# Patient Record
Sex: Female | Born: 1970 | Race: Black or African American | Hispanic: No | Marital: Single | Smoking: Current some day smoker
Health system: Southern US, Community
[De-identification: ages and names within clinical notes are randomized; demographics above are authoritative.]

## PROBLEM LIST (undated history)

## (undated) DIAGNOSIS — I10 Essential (primary) hypertension: Secondary | ICD-10-CM

## (undated) HISTORY — PX: TUBAL LIGATION: SHX77

---

## 2006-08-03 DIAGNOSIS — I10 Essential (primary) hypertension: Secondary | ICD-10-CM

## 2006-08-03 HISTORY — DX: Essential (primary) hypertension: I10

## 2008-09-09 ENCOUNTER — Emergency Department (HOSPITAL_COMMUNITY): Admission: EM | Admit: 2008-09-09 | Discharge: 2008-09-09 | Payer: Self-pay | Admitting: Emergency Medicine

## 2009-06-30 ENCOUNTER — Emergency Department (HOSPITAL_COMMUNITY): Admission: EM | Admit: 2009-06-30 | Discharge: 2009-06-30 | Payer: Self-pay | Admitting: Emergency Medicine

## 2009-09-25 ENCOUNTER — Encounter: Admission: RE | Admit: 2009-09-25 | Discharge: 2009-09-25 | Payer: Self-pay | Admitting: Infectious Diseases

## 2010-02-15 ENCOUNTER — Emergency Department (HOSPITAL_COMMUNITY): Admission: EM | Admit: 2010-02-15 | Discharge: 2010-02-15 | Payer: Self-pay | Admitting: Emergency Medicine

## 2010-03-02 ENCOUNTER — Emergency Department (HOSPITAL_COMMUNITY): Admission: EM | Admit: 2010-03-02 | Discharge: 2010-03-02 | Payer: Self-pay | Admitting: Emergency Medicine

## 2010-08-31 ENCOUNTER — Emergency Department (HOSPITAL_COMMUNITY)
Admission: EM | Admit: 2010-08-31 | Discharge: 2010-08-31 | Payer: Self-pay | Source: Home / Self Care | Admitting: Emergency Medicine

## 2010-11-05 LAB — POCT I-STAT, CHEM 8
Calcium, Ion: 1.17 mmol/L (ref 1.12–1.32)
Hemoglobin: 12.6 g/dL (ref 12.0–15.0)
Potassium: 4.5 mEq/L (ref 3.5–5.1)
TCO2: 25 mmol/L (ref 0–100)

## 2010-11-05 LAB — URINALYSIS, ROUTINE W REFLEX MICROSCOPIC
Bilirubin Urine: NEGATIVE
Ketones, ur: NEGATIVE mg/dL
Leukocytes, UA: NEGATIVE
Nitrite: NEGATIVE

## 2010-11-05 LAB — URINE MICROSCOPIC-ADD ON

## 2010-11-05 LAB — GC/CHLAMYDIA PROBE AMP, GENITAL: GC Probe Amp, Genital: NEGATIVE

## 2010-11-05 LAB — WET PREP, GENITAL: Trich, Wet Prep: NONE SEEN

## 2011-04-02 ENCOUNTER — Emergency Department (HOSPITAL_COMMUNITY)
Admission: EM | Admit: 2011-04-02 | Discharge: 2011-04-02 | Disposition: A | Discharge status: Home / Self Care | Payer: Self-pay | Attending: Emergency Medicine | Admitting: Emergency Medicine

## 2011-04-02 DIAGNOSIS — L298 Other pruritus: Secondary | ICD-10-CM | POA: Insufficient documentation

## 2011-04-02 DIAGNOSIS — F329 Major depressive disorder, single episode, unspecified: Secondary | ICD-10-CM | POA: Insufficient documentation

## 2011-04-02 DIAGNOSIS — I10 Essential (primary) hypertension: Secondary | ICD-10-CM | POA: Insufficient documentation

## 2011-04-02 DIAGNOSIS — F3289 Other specified depressive episodes: Secondary | ICD-10-CM | POA: Insufficient documentation

## 2011-04-02 DIAGNOSIS — K089 Disorder of teeth and supporting structures, unspecified: Secondary | ICD-10-CM | POA: Insufficient documentation

## 2011-04-02 DIAGNOSIS — R21 Rash and other nonspecific skin eruption: Secondary | ICD-10-CM | POA: Insufficient documentation

## 2011-04-02 DIAGNOSIS — K029 Dental caries, unspecified: Secondary | ICD-10-CM | POA: Insufficient documentation

## 2011-04-02 DIAGNOSIS — K0381 Cracked tooth: Secondary | ICD-10-CM | POA: Insufficient documentation

## 2011-04-02 DIAGNOSIS — L2989 Other pruritus: Secondary | ICD-10-CM | POA: Insufficient documentation

## 2011-04-02 DIAGNOSIS — R1011 Right upper quadrant pain: Secondary | ICD-10-CM | POA: Insufficient documentation

## 2011-04-02 LAB — URINALYSIS, ROUTINE W REFLEX MICROSCOPIC
Glucose, UA: NEGATIVE mg/dL
Ketones, ur: 15 mg/dL — AB
Nitrite: NEGATIVE
pH: 5.5 (ref 5.0–8.0)

## 2011-04-09 ENCOUNTER — Emergency Department (HOSPITAL_COMMUNITY)
Admission: EM | Admit: 2011-04-09 | Discharge: 2011-04-10 | Disposition: A | Discharge status: Home / Self Care | Payer: Self-pay | Attending: Emergency Medicine | Admitting: Emergency Medicine

## 2011-04-09 DIAGNOSIS — R21 Rash and other nonspecific skin eruption: Secondary | ICD-10-CM | POA: Insufficient documentation

## 2011-04-09 DIAGNOSIS — F329 Major depressive disorder, single episode, unspecified: Secondary | ICD-10-CM | POA: Insufficient documentation

## 2011-04-09 DIAGNOSIS — F17200 Nicotine dependence, unspecified, uncomplicated: Secondary | ICD-10-CM | POA: Insufficient documentation

## 2011-04-09 DIAGNOSIS — I10 Essential (primary) hypertension: Secondary | ICD-10-CM | POA: Insufficient documentation

## 2011-04-09 DIAGNOSIS — F3289 Other specified depressive episodes: Secondary | ICD-10-CM | POA: Insufficient documentation

## 2012-06-30 ENCOUNTER — Emergency Department (HOSPITAL_COMMUNITY)
Admission: EM | Admit: 2012-06-30 | Discharge: 2012-06-30 | Disposition: A | Discharge status: Home / Self Care | Payer: Self-pay | Attending: Emergency Medicine | Admitting: Emergency Medicine

## 2012-06-30 ENCOUNTER — Encounter (HOSPITAL_COMMUNITY): Payer: Self-pay | Admitting: *Deleted

## 2012-06-30 DIAGNOSIS — Z2089 Contact with and (suspected) exposure to other communicable diseases: Secondary | ICD-10-CM

## 2012-06-30 DIAGNOSIS — L299 Pruritus, unspecified: Secondary | ICD-10-CM | POA: Insufficient documentation

## 2012-06-30 DIAGNOSIS — F17200 Nicotine dependence, unspecified, uncomplicated: Secondary | ICD-10-CM | POA: Insufficient documentation

## 2012-06-30 DIAGNOSIS — B86 Scabies: Secondary | ICD-10-CM | POA: Insufficient documentation

## 2012-06-30 MED ORDER — HYDROXYZINE HCL 10 MG PO TABS: 10.0000 mg | ORAL_TABLET | Freq: Four times a day (QID) | ORAL | Status: DC | PRN

## 2012-06-30 MED ORDER — LORAZEPAM 0.5 MG PO TABS
0.5000 mg | ORAL_TABLET | Freq: Once | ORAL | Status: AC
  Administered 2012-06-30: 0.5 mg via ORAL
  Filled 2012-06-30: qty 1

## 2012-06-30 MED ORDER — PERMETHRIN 5 % EX CREA: TOPICAL_CREAM | CUTANEOUS | Status: DC

## 2012-06-30 MED ORDER — HYDROXYZINE HCL 25 MG PO TABS
25.0000 mg | ORAL_TABLET | Freq: Once | ORAL | Status: AC
  Administered 2012-06-30: 25 mg via ORAL
  Filled 2012-06-30: qty 1

## 2012-06-30 MED ORDER — LORAZEPAM 2 MG/ML IJ SOLN: 0.5000 mg | Freq: Once | INTRAMUSCULAR | Status: DC
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
## 2012-06-30 NOTE — ED Notes (Signed)
Pt states that she has had scabies before and the same sensation today. Pt states rash on her throat and under her arms. Pt states sensation of biting and itching. Pt got it from her friend.

## 2012-07-01 NOTE — ED Provider Notes (Signed)
Medical screening examination/treatment/procedure(s) were performed by non-physician practitioner and as supervising physician I was immediately available for consultation/collaboration.   Steen Bisig, MD 07/01/12 0027 

## 2012-07-17 ENCOUNTER — Encounter (HOSPITAL_COMMUNITY): Payer: Self-pay | Admitting: *Deleted

## 2012-07-17 ENCOUNTER — Emergency Department (HOSPITAL_COMMUNITY)
Admission: EM | Admit: 2012-07-17 | Discharge: 2012-07-17 | Disposition: A | Discharge status: Home / Self Care | Payer: Self-pay | Attending: Emergency Medicine | Admitting: Emergency Medicine

## 2012-07-17 DIAGNOSIS — L299 Pruritus, unspecified: Secondary | ICD-10-CM | POA: Insufficient documentation

## 2012-07-17 DIAGNOSIS — F17200 Nicotine dependence, unspecified, uncomplicated: Secondary | ICD-10-CM | POA: Insufficient documentation

## 2012-07-17 MED ORDER — DIPHENHYDRAMINE HCL 25 MG PO CAPS: 25.0000 mg | ORAL_CAPSULE | Freq: Once | ORAL | Status: DC

## 2012-07-17 NOTE — ED Notes (Signed)
Pt left before being given benadryl po.

## 2012-07-17 NOTE — ED Provider Notes (Signed)
History   This chart was scribed for Suzi Roots, MD by Melba Coon, ED Scribe. The patient was seen in room TR01C/TR01C and the patient's care was started at 5:45PM.    CSN: [id number]  Arrival date & time 07/17/12  1710   None     Chief Complaint  Patient presents with  . Rash    (Consider location/radiation/quality/duration/timing/severity/associated sxs/prior treatment) The history is provided by the patient. No language interpreter was used.   Diana Bean is a 41 y.o. female who presents to the Emergency Department complaining of persistent, moderate to severe rash to the neck with an onset 3 weeks ago. She has been seen in the ED in the past 3 weeks for similar rash symptoms. She was told it was scabies in the past but she can not afford medication. She describes the pain as pruritic and pinching. She denies using any new soaps, sprays, or body products. Denies HA, fever, sore throat. She denies rash to any other parts of her body. No known allergies. No other pertinent medical symptoms. No change in meds or new meds. No change in any home or personal products. No change in diet. No rash or skin lesions. Itching localized to area around neck/superior chest.   History reviewed. No pertinent past medical history.  History reviewed. No pertinent past surgical history.  History reviewed. No pertinent family history.  History  Substance Use Topics  . Smoking status: Current Every Day Smoker  . Smokeless tobacco: Not on file  . Alcohol Use: Yes    OB History    Grav Para Term Preterm Abortions TAB SAB Ect Mult Living                  Review of Systems 10 Systems reviewed and all are negative for acute change except as noted in the HPI.   Allergies  Review of patient's allergies indicates no known allergies.  Home Medications   Current Outpatient Rx  Name  Route  Sig  Dispense  Refill  . DIPHENHYDRAMINE HCL 25 MG PO TABS   Oral   Take 25 mg by mouth every 6  (six) hours as needed. For itching         . IBUPROFEN 200 MG PO TABS   Oral   Take 600 mg by mouth every 6 (six) hours as needed. For pain           BP 121/75  Pulse 94  Temp 97.7 F (36.5 C) (Oral)  Resp 18  SpO2 100%  Physical Exam  Nursing note and vitals reviewed. Constitutional: She is oriented to person, place, and time. She appears well-developed and well-nourished. No distress.       Awake, alert, nontoxic appearance.  HENT:  Head: Atraumatic.  Nose: Nose normal.  Mouth/Throat: Oropharynx is clear and moist.  Eyes: Conjunctivae normal are normal. Right eye exhibits no discharge. Left eye exhibits no discharge.  Neck: Normal range of motion. Neck supple.  Cardiovascular: Normal rate, regular rhythm and normal heart sounds.   No murmur heard. Pulmonary/Chest: Effort normal and breath sounds normal. No respiratory distress. She has no wheezes. She has no rales. She exhibits no tenderness.  Abdominal: Soft. There is no tenderness.  Musculoskeletal: She exhibits no tenderness.       Baseline ROM, no obvious new focal weakness.  Neurological: She is alert and oriented to person, place, and time.       Mental status and motor strength appears baseline for patient  and situation.  Skin: Skin is warm and dry. No rash noted. She is not diaphoretic.  Psychiatric: She has a normal mood and affect.    ED Course  Procedures (including critical care time)  COORDINATION OF CARE:  5:48PM - benadryl will be ordered for Diana Bean. She is advised that if she does have scabies, then the originally Rx medication from her last visit is the only option for treatment.     MDM  I personally performed the services described in this documentation, which was scribed in my presence. The recorded information has been reviewed and is accurate.  Pt requests med for itching. Benadryl po, pt states has ride, does not have to drive.  No rash or skin lesions noted.        Suzi Roots, MD 07/17/12 [phone]

## 2012-07-17 NOTE — ED Notes (Signed)
Reports being seen here recently for same, has itching rash to neck, was told it was scabies but unable to afford the cream prescribed.

## 2014-03-28 ENCOUNTER — Other Ambulatory Visit: Payer: Self-pay | Admitting: Infectious Disease

## 2014-03-28 ENCOUNTER — Ambulatory Visit
Admission: RE | Admit: 2014-03-28 | Discharge: 2014-03-28 | Disposition: A | Discharge status: Home / Self Care | Payer: No Typology Code available for payment source | Source: Ambulatory Visit | Attending: Infectious Disease | Admitting: Infectious Disease

## 2014-03-28 DIAGNOSIS — Z111 Encounter for screening for respiratory tuberculosis: Secondary | ICD-10-CM

## 2014-04-25 ENCOUNTER — Encounter (HOSPITAL_COMMUNITY): Payer: Self-pay | Admitting: Emergency Medicine

## 2014-04-25 ENCOUNTER — Emergency Department (HOSPITAL_COMMUNITY)
Admission: EM | Admit: 2014-04-25 | Discharge: 2014-04-25 | Disposition: A | Discharge status: Home / Self Care | Payer: Self-pay | Attending: Emergency Medicine | Admitting: Emergency Medicine

## 2014-04-25 DIAGNOSIS — M549 Dorsalgia, unspecified: Secondary | ICD-10-CM | POA: Insufficient documentation

## 2014-04-25 DIAGNOSIS — F17200 Nicotine dependence, unspecified, uncomplicated: Secondary | ICD-10-CM | POA: Insufficient documentation

## 2014-04-25 DIAGNOSIS — Z791 Long term (current) use of non-steroidal anti-inflammatories (NSAID): Secondary | ICD-10-CM | POA: Insufficient documentation

## 2014-04-25 MED ORDER — CYCLOBENZAPRINE HCL 10 MG PO TABS: 10.0000 mg | ORAL_TABLET | Freq: Two times a day (BID) | ORAL | Status: DC | PRN

## 2014-04-25 MED ORDER — MELOXICAM 7.5 MG PO TABS: 7.5000 mg | ORAL_TABLET | Freq: Every day | ORAL | Status: DC

## 2014-04-25 NOTE — ED Provider Notes (Signed)
CSN: [id number]     Arrival date & time 04/25/14  1753 History  This chart was scribed for non-physician practitioner, Noland Fordyce, PA-C,working with Wandra Arthurs, MD, by Marlowe Kays, ED Scribe. This patient was seen in room TR06C/TR06C and the patient's care was started at 6:37 PM.  Chief Complaint  Patient presents with  . Back Pain   Patient is a 43 y.o. female presenting with back pain. The history is provided by the patient. No language interpreter was used.  Back Pain Associated symptoms: no dysuria and no fever    HPI Comments:  Diana Bean is a 43 y.o. female who presents to the Emergency Department complaining of constant moderate sharp, aching mid, right-sided back pain onset last night. Pt states the pain begins in the back and radiates around her right side. She states she has taken aspirin with no relief of the pain. She reports taking a shower and letting the water run over the area with some relief. Movement makes the pain worse. Sitting still helps the pain subside slightly. She denies any trauma, injury or fall. She denies fever, chills, nausea, vomiting or dysuria. She denies any recent surgeries. No h/o nephrolithiasis, back surgery or abdominal surgery. She does not have a PCP.  History reviewed. No pertinent past medical history. History reviewed. No pertinent past surgical history. History reviewed. No pertinent family history. History  Substance Use Topics  . Smoking status: Current Every Day Smoker  . Smokeless tobacco: Not on file  . Alcohol Use: Yes   OB History   Grav Para Term Preterm Abortions TAB SAB Ect Mult Living                 Review of Systems  Constitutional: Negative for fever and chills.  Gastrointestinal: Negative for nausea and vomiting.  Genitourinary: Negative for dysuria.  Musculoskeletal: Positive for back pain.  All other systems reviewed and are negative.   Allergies  Review of patient's allergies indicates no known  allergies.  Home Medications   Prior to Admission medications   Medication Sig Start Date End Date Taking? Authorizing Provider  cyclobenzaprine (FLEXERIL) 10 MG tablet Take 1 tablet (10 mg total) by mouth 2 (two) times daily as needed for muscle spasms. 04/25/14   Noland Fordyce, PA-C  meloxicam (MOBIC) 7.5 MG tablet Take 1 tablet (7.5 mg total) by mouth daily. 04/25/14   Noland Fordyce, PA-C   Triage Vitals: BP 145/86  Pulse 82  Temp(Src) 98.2 F (36.8 C) (Oral)  Resp 15  Ht 5\' 6"  (1.676 m)  Wt 180 lb (81.647 kg)  BMI 29.07 kg/m2  SpO2 96% Physical Exam  Nursing note and vitals reviewed. Constitutional: She is oriented to person, place, and time. She appears well-developed and well-nourished.  HENT:  Head: Normocephalic and atraumatic.  Eyes: EOM are normal.  Neck: Normal range of motion.  Cardiovascular: Normal rate, regular rhythm and normal heart sounds.  Exam reveals no gallop and no friction rub.   No murmur heard. Pulmonary/Chest: Effort normal and breath sounds normal. No respiratory distress. She has no wheezes. She has no rales.  Abdominal: Soft. There is no tenderness.  Musculoskeletal: Normal range of motion. She exhibits tenderness.  Tender to right side of thoracic musculature. No midline spinal tenderness. Full ROM of all extremities.  Neurological: She is alert and oriented to person, place, and time.  Skin: Skin is warm and dry. No rash noted.  Psychiatric: She has a normal mood and affect. Her behavior is  normal.    ED Course  Procedures (including critical care time) DIAGNOSTIC STUDIES: Oxygen Saturation is 96% on RA, normal by my interpretation.   COORDINATION OF CARE: 6:43 PM- Will order pain medication prior to discharge. Will prescribe muscle relaxer and advised pt of return precautions. Pt verbalizes understanding and agrees to plan.  Medications - No data to display  Labs Review Labs Reviewed - No data to display  Imaging Review No results  found.   EKG Interpretation None      MDM   Final diagnoses:  Mid back pain on right side   Pt c/o mid-back right sided pain.  No known injuries. Denies fever, n/v/d. Pain does radiate into right flank. Denies urinary or vaginal symptoms. Pt is tender in right mid-back pain.   Denies hx of renal stones. Not concerned for emergent process taking place at this time. No red flag symptoms. Do not believe further workup needed at this time. Will tx as muscle strain. encouraged pt to f/u with PCP in 3-4 days if not improving. Return precautions provided. Pt verbalized understanding and agreement with tx plan.   I personally performed the services described in this documentation, which was scribed in my presence. The recorded information has been reviewed and is accurate.    Noland Fordyce, PA-C 04/25/14 2028

## 2014-04-25 NOTE — ED Provider Notes (Signed)
Medical screening examination/treatment/procedure(s) were performed by non-physician practitioner and as supervising physician I was immediately available for consultation/collaboration.   EKG Interpretation None        Wandra Arthurs, MD 04/25/14 2349

## 2014-04-25 NOTE — ED Notes (Signed)
PA at the bedside.

## 2014-04-25 NOTE — ED Notes (Signed)
Pt reports onset of mid right side back pain that started last night. Denies injury to back. Reports pain radiates around to right rib area. Pain increases wth movement, denies any urinary symptoms.

## 2014-07-17 ENCOUNTER — Encounter (HOSPITAL_COMMUNITY): Payer: Self-pay

## 2014-07-17 ENCOUNTER — Emergency Department (HOSPITAL_COMMUNITY)
Admission: EM | Admit: 2014-07-17 | Discharge: 2014-07-17 | Disposition: A | Discharge status: Home / Self Care | Payer: Self-pay | Attending: Emergency Medicine | Admitting: Emergency Medicine

## 2014-07-17 DIAGNOSIS — Y998 Other external cause status: Secondary | ICD-10-CM | POA: Insufficient documentation

## 2014-07-17 DIAGNOSIS — S0083XA Contusion of other part of head, initial encounter: Secondary | ICD-10-CM | POA: Insufficient documentation

## 2014-07-17 DIAGNOSIS — Y9289 Other specified places as the place of occurrence of the external cause: Secondary | ICD-10-CM | POA: Insufficient documentation

## 2014-07-17 DIAGNOSIS — Y9389 Activity, other specified: Secondary | ICD-10-CM | POA: Insufficient documentation

## 2014-07-17 DIAGNOSIS — S299XXA Unspecified injury of thorax, initial encounter: Secondary | ICD-10-CM | POA: Insufficient documentation

## 2014-07-17 DIAGNOSIS — Z720 Tobacco use: Secondary | ICD-10-CM | POA: Insufficient documentation

## 2014-07-17 DIAGNOSIS — Z791 Long term (current) use of non-steroidal anti-inflammatories (NSAID): Secondary | ICD-10-CM | POA: Insufficient documentation

## 2014-07-17 MED ORDER — HYDROCODONE-ACETAMINOPHEN 5-325 MG PO TABS
1.0000 | ORAL_TABLET | ORAL | Status: DC | PRN
Start: 2014-07-17 — End: 2015-04-19

## 2014-07-17 MED ORDER — HYDROCODONE-ACETAMINOPHEN 5-325 MG PO TABS
1.0000 | ORAL_TABLET | Freq: Once | ORAL | Status: AC
  Administered 2014-07-17: 1 via ORAL
  Filled 2014-07-17: qty 1

## 2014-07-17 MED ORDER — IBUPROFEN 800 MG PO TABS: 800.0000 mg | ORAL_TABLET | Freq: Three times a day (TID) | ORAL | Status: DC
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
## 2014-07-17 NOTE — Discharge Instructions (Signed)
Contusion A contusion is a deep bruise. Contusions are the result of an injury that caused bleeding under the skin. The contusion may turn blue, purple, or yellow. Minor injuries will give you a painless contusion, but more severe contusions may stay painful and swollen for a few weeks.  CAUSES  A contusion is usually caused by a blow, trauma, or direct force to an area of the body. SYMPTOMS   Swelling and redness of the injured area.  Bruising of the injured area.  Tenderness and soreness of the injured area.  Pain. DIAGNOSIS  The diagnosis can be made by taking a history and physical exam. An X-ray, CT scan, or MRI may be needed to determine if there were any associated injuries, such as fractures. TREATMENT  Specific treatment will depend on what area of the body was injured. In general, the best treatment for a contusion is resting, icing, elevating, and applying cold compresses to the injured area. Over-the-counter medicines may also be recommended for pain control. Ask your caregiver what the best treatment is for your contusion. HOME CARE INSTRUCTIONS   Put ice on the injured area.  Put ice in a plastic bag.  Place a towel between your skin and the bag.  Leave the ice on for 15-20 minutes, 3-4 times a day, or as directed by your health care provider.  Only take over-the-counter or prescription medicines for pain, discomfort, or fever as directed by your caregiver. Your caregiver may recommend avoiding anti-inflammatory medicines (aspirin, ibuprofen, and naproxen) for 48 hours because these medicines may increase bruising.  Rest the injured area.  If possible, elevate the injured area to reduce swelling. SEEK IMMEDIATE MEDICAL CARE IF:   You have increased bruising or swelling.  You have pain that is getting worse.  Your swelling or pain is not relieved with medicines. MAKE SURE YOU:   Understand these instructions.  Will watch your condition.  Will get help right  away if you are not doing well or get worse. Document Released: 04/29/2005 Document Revised: 07/25/2013 Document Reviewed: 05/25/2011 [facility] Patient Information 2015 [place], [place]. This information is not intended to replace advice given to you by your health care provider. Make sure you discuss any questions you have with your health care provider.  Cryotherapy Cryotherapy means treatment with cold. Ice or gel packs can be used to reduce both pain and swelling. Ice is the most helpful within the first 24 to 48 hours after an injury or flare-up from overusing a muscle or joint. Sprains, strains, spasms, burning pain, shooting pain, and aches can all be eased with ice. Ice can also be used when recovering from surgery. Ice is effective, has very few side effects, and is safe for most people to use. PRECAUTIONS  Ice is not a safe treatment option for people with:  Raynaud phenomenon. This is a condition affecting small blood vessels in the extremities. Exposure to cold may cause your problems to return.  Cold hypersensitivity. There are many forms of cold hypersensitivity, including:  Cold urticaria. Red, itchy hives appear on the skin when the tissues begin to warm after being iced.  Cold erythema. This is a red, itchy rash caused by exposure to cold.  Cold hemoglobinuria. Red blood cells break down when the tissues begin to warm after being iced. The hemoglobin that carry oxygen are passed into the urine because they cannot combine with blood proteins fast enough.  Numbness or altered sensitivity in the area being iced. If you have any  of the following conditions, do not use ice until you have discussed cryotherapy with your caregiver:  Heart conditions, such as arrhythmia, angina, or chronic heart disease.  High blood pressure.  Healing wounds or open skin in the area being iced.  Current infections.  Rheumatoid arthritis.  Poor circulation.  Diabetes. Ice slows the blood flow  in the region it is applied. This is beneficial when trying to stop inflamed tissues from spreading irritating chemicals to surrounding tissues. However, if you expose your skin to cold temperatures for too long or without the proper protection, you can damage your skin or nerves. Watch for signs of skin damage due to cold. HOME CARE INSTRUCTIONS Follow these tips to use ice and cold packs safely.  Place a dry or damp towel between the ice and skin. A damp towel will cool the skin more quickly, so you may need to shorten the time that the ice is used.  For a more rapid response, add gentle compression to the ice.  Ice for no more than 10 to 20 minutes at a time. The bonier the area you are icing, the less time it will take to get the benefits of ice.  Check your skin after 5 minutes to make sure there are no signs of a poor response to cold or skin damage.  Rest 20 minutes or more between uses.  Once your skin is numb, you can end your treatment. You can test numbness by very lightly touching your skin. The touch should be so light that you do not see the skin dimple from the pressure of your fingertip. When using ice, most people will feel these normal sensations in this order: cold, burning, aching, and numbness.  Do not use ice on someone who cannot communicate their responses to pain, such as small children or people with dementia. HOW TO MAKE AN ICE PACK Ice packs are the most common way to use ice therapy. Other methods include ice massage, ice baths, and cryosprays. Muscle creams that cause a cold, tingly feeling do not offer the same benefits that ice offers and should not be used as a substitute unless recommended by your caregiver. To make an ice pack, do one of the following:  Place crushed ice or a bag of frozen vegetables in a sealable plastic bag. Squeeze out the excess air. Place this bag inside another plastic bag. Slide the bag into a pillowcase or place a damp towel between  your skin and the bag.  Mix 3 parts water with 1 part rubbing alcohol. Freeze the mixture in a sealable plastic bag. When you remove the mixture from the freezer, it will be slushy. Squeeze out the excess air. Place this bag inside another plastic bag. Slide the bag into a pillowcase or place a damp towel between your skin and the bag. SEEK MEDICAL CARE IF:  You develop white spots on your skin. This may give the skin a blotchy (mottled) appearance.  Your skin turns blue or pale.  Your skin becomes waxy or hard.  Your swelling gets worse. MAKE SURE YOU:   Understand these instructions.  Will watch your condition.  Will get help right away if you are not doing well or get worse. Document Released: 03/16/2011 Document Revised: 12/04/2013 Document Reviewed: 03/16/2011 [facility] Patient Information 2015 [place], [place]. This information is not intended to replace advice given to you by your health care provider. Make sure you discuss any questions you have with your health care provider.
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
## 2015-04-19 ENCOUNTER — Emergency Department (HOSPITAL_COMMUNITY)
Admission: EM | Admit: 2015-04-19 | Discharge: 2015-04-19 | Disposition: A | Discharge status: Home / Self Care | Payer: Self-pay | Attending: Emergency Medicine | Admitting: Emergency Medicine

## 2015-04-19 ENCOUNTER — Encounter (HOSPITAL_COMMUNITY): Payer: Self-pay | Admitting: Emergency Medicine

## 2015-04-19 DIAGNOSIS — K047 Periapical abscess without sinus: Secondary | ICD-10-CM | POA: Insufficient documentation

## 2015-04-19 DIAGNOSIS — Z720 Tobacco use: Secondary | ICD-10-CM | POA: Insufficient documentation

## 2015-04-19 MED ORDER — CLINDAMYCIN HCL 150 MG PO CAPS
150.0000 mg | ORAL_CAPSULE | Freq: Four times a day (QID) | ORAL | Status: DC
Start: 2015-04-19 — End: 2015-07-13

## 2015-04-19 MED ORDER — HYDROCODONE-ACETAMINOPHEN 5-325 MG PO TABS
1.0000 | ORAL_TABLET | Freq: Once | ORAL | Status: AC
  Administered 2015-04-19: 1 via ORAL
  Filled 2015-04-19: qty 1

## 2015-04-19 MED ORDER — HYDROCODONE-ACETAMINOPHEN 5-325 MG PO TABS: 1.0000 | ORAL_TABLET | ORAL | Status: DC | PRN

## 2015-04-19 MED ORDER — IBUPROFEN 800 MG PO TABS
800.0000 mg | ORAL_TABLET | Freq: Three times a day (TID) | ORAL | Status: DC
Start: 2015-04-19 — End: 2015-07-13

## 2015-04-19 MED ORDER — CLINDAMYCIN HCL 150 MG PO CAPS
300.0000 mg | ORAL_CAPSULE | Freq: Once | ORAL | Status: AC
  Administered 2015-04-19: 300 mg via ORAL
  Filled 2015-04-19: qty 2

## 2015-04-19 NOTE — Discharge Instructions (Signed)

## 2015-04-19 NOTE — ED Notes (Signed)
Pt st's there was "a bubble" on right upper gum and while on the way to ED it popped.  St;s this relieved some of the pain

## 2015-04-19 NOTE — ED Provider Notes (Signed)
CSN: [id number]     Arrival date & time 04/19/15  1948 History  This chart was scribed for non-physician practitioner Domenic Moras, PA-C working with Ripley Fraise, MD by Meriel Pica, ED Scribe. This patient was seen in room TR05C/TR05C and the patient's care was started at 8:24 PM.     Chief Complaint  Patient presents with  . Dental Pain   The history is provided by the patient. No language interpreter was used.   HPI Comments: Diana Bean is a 44 y.o. female who presents to the Emergency Department complaining of sudden onset, worsening, right-sided facial swelling onset this morning with waking up. She reports associated right dental pain that has been present for 1 week. Occasionally smokes. She has iced the affected area, taken ibuprofen, and applied oral gel to right dental pain with no relief of symptoms. She believes the filling in the tooth is coming out. Denies pain with right eye movements, fevers or chills. NKDA  No past medical history on file. Past Surgical History  Procedure Laterality Date  . Tubes tied     No family history on file. Social History  Substance Use Topics  . Smoking status: Current Every Day Smoker  . Smokeless tobacco: Not on file  . Alcohol Use: Yes   OB History    No data available     Review of Systems  Constitutional: Negative for fever and chills.  HENT: Positive for dental problem and facial swelling.   Eyes: Negative for pain.    Allergies  Review of patient's allergies indicates no known allergies.  Home Medications   Prior to Admission medications   Medication Sig Start Date End Date Taking? Authorizing Provider  cyclobenzaprine (FLEXERIL) 10 MG tablet Take 1 tablet (10 mg total) by mouth 2 (two) times daily as needed for muscle spasms. Patient not taking: Reported on 07/17/2014 04/25/14   Noland Fordyce, PA-C  HYDROcodone-acetaminophen (NORCO/VICODIN) 5-325 MG per tablet Take 1-2 tablets by mouth every 4 (four) hours as needed.  07/17/14   Charlann Lange, PA-C  ibuprofen (ADVIL,MOTRIN) 800 MG tablet Take 1 tablet (800 mg total) by mouth 3 (three) times daily. 07/17/14   Charlann Lange, PA-C  meloxicam (MOBIC) 7.5 MG tablet Take 1 tablet (7.5 mg total) by mouth daily. Patient not taking: Reported on 07/17/2014 04/25/14   Noland Fordyce, PA-C  naproxen sodium (ANAPROX) 220 MG tablet Take 220 mg by mouth daily as needed (pain).    Historical Provider, MD   BP 134/77 mmHg  Pulse 94  Temp(Src) 98.4 F (36.9 C) (Oral)  Resp 20  SpO2 100% Physical Exam  Constitutional: She appears well-developed and well-nourished. No distress.  HENT:  Head: Normocephalic.  Significant dental decay to tooth #3 and #6 with mild tenderness to #6, mild trismus noted, significant right-sided facial swelling noted.   Eyes: Conjunctivae and EOM are normal. Pupils are equal, round, and reactive to light.  Neck: No JVD present.  Pulmonary/Chest: Effort normal. No respiratory distress.  Neurological: She is alert. Coordination normal.  Skin: Skin is warm. No rash noted. No erythema. No pallor.  Psychiatric: She has a normal mood and affect. Her behavior is normal.  Nursing note and vitals reviewed.   ED Course  Procedures  DIAGNOSTIC STUDIES: Oxygen Saturation is 100% on RA, normal by my interpretation.    COORDINATION OF CARE: 8:27 PM evidence of periapical abscess with facial involvement.  No orbital cellulitis.  Pt is afebrile, vss.  Discussed treatment plan which includes to order  and prescribe Norco and clindamycin 300mg  with pt. Will give pt a dental referral. Advised return precautions if symptoms worsen within the next 2 days. Pt acknowledges and agrees to plan.    MDM   Final diagnoses:  Periapical abscess with facial involvement    BP 134/77 mmHg  Pulse 94  Temp(Src) 98.4 F (36.9 C) (Oral)  Resp 20  SpO2 100%   I personally performed the services described in this documentation, which was scribed in my presence. The  recorded information has been reviewed and is accurate.     Domenic Moras, PA-C 04/19/15 2032  Ripley Fraise, MD 04/20/15 [phone]

## 2015-04-19 NOTE — ED Notes (Signed)
Pt st;'s she woke up this am with swelling to right side of face and pain to right upper tooth.

## 2015-07-13 ENCOUNTER — Emergency Department (HOSPITAL_COMMUNITY)
Admission: EM | Admit: 2015-07-13 | Discharge: 2015-07-13 | Disposition: A | Discharge status: Home / Self Care | Payer: Self-pay | Attending: Emergency Medicine | Admitting: Emergency Medicine

## 2015-07-13 ENCOUNTER — Encounter (HOSPITAL_COMMUNITY): Payer: Self-pay | Admitting: Family Medicine

## 2015-07-13 ENCOUNTER — Encounter (HOSPITAL_COMMUNITY): Payer: Self-pay | Admitting: Nurse Practitioner

## 2015-07-13 ENCOUNTER — Emergency Department (HOSPITAL_COMMUNITY)
Admission: EM | Admit: 2015-07-13 | Discharge: 2015-07-13 | Discharge status: Against Medical Advice | Payer: Self-pay | Attending: Emergency Medicine | Admitting: Emergency Medicine

## 2015-07-13 DIAGNOSIS — S39011A Strain of muscle, fascia and tendon of abdomen, initial encounter: Secondary | ICD-10-CM | POA: Insufficient documentation

## 2015-07-13 DIAGNOSIS — X58XXXA Exposure to other specified factors, initial encounter: Secondary | ICD-10-CM | POA: Insufficient documentation

## 2015-07-13 DIAGNOSIS — F17200 Nicotine dependence, unspecified, uncomplicated: Secondary | ICD-10-CM | POA: Insufficient documentation

## 2015-07-13 DIAGNOSIS — Y9289 Other specified places as the place of occurrence of the external cause: Secondary | ICD-10-CM | POA: Insufficient documentation

## 2015-07-13 DIAGNOSIS — Y9389 Activity, other specified: Secondary | ICD-10-CM | POA: Insufficient documentation

## 2015-07-13 DIAGNOSIS — Z791 Long term (current) use of non-steroidal anti-inflammatories (NSAID): Secondary | ICD-10-CM | POA: Insufficient documentation

## 2015-07-13 DIAGNOSIS — T148XXA Other injury of unspecified body region, initial encounter: Secondary | ICD-10-CM

## 2015-07-13 DIAGNOSIS — R109 Unspecified abdominal pain: Secondary | ICD-10-CM | POA: Insufficient documentation

## 2015-07-13 DIAGNOSIS — Z792 Long term (current) use of antibiotics: Secondary | ICD-10-CM | POA: Insufficient documentation

## 2015-07-13 DIAGNOSIS — Y998 Other external cause status: Secondary | ICD-10-CM | POA: Insufficient documentation

## 2015-07-13 DIAGNOSIS — Z3202 Encounter for pregnancy test, result negative: Secondary | ICD-10-CM | POA: Insufficient documentation

## 2015-07-13 LAB — URINALYSIS, ROUTINE W REFLEX MICROSCOPIC
BILIRUBIN URINE: NEGATIVE
Bilirubin Urine: NEGATIVE
GLUCOSE, UA: NEGATIVE mg/dL
GLUCOSE, UA: NEGATIVE mg/dL
HGB URINE DIPSTICK: NEGATIVE
Hgb urine dipstick: NEGATIVE
Ketones, ur: NEGATIVE mg/dL
Ketones, ur: NEGATIVE mg/dL
LEUKOCYTES UA: NEGATIVE
Leukocytes, UA: NEGATIVE
NITRITE: NEGATIVE
Nitrite: NEGATIVE
PH: 6 (ref 5.0–8.0)
PROTEIN: NEGATIVE mg/dL
Protein, ur: NEGATIVE mg/dL
Specific Gravity, Urine: 1.025 (ref 1.005–1.030)
Specific Gravity, Urine: 1.023 (ref 1.005–1.030)
pH: 7 (ref 5.0–8.0)

## 2015-07-13 LAB — PREGNANCY, URINE: PREG TEST UR: NEGATIVE

## 2015-07-13 LAB — POC URINE PREG, ED: PREG TEST UR: NEGATIVE

## 2015-07-13 MED ORDER — CYCLOBENZAPRINE HCL 10 MG PO TABS: 10.0000 mg | ORAL_TABLET | Freq: Two times a day (BID) | ORAL | Status: DC | PRN

## 2015-07-13 NOTE — ED Notes (Signed)
Patient left at this time with all belongings. 

## 2015-07-13 NOTE — ED Notes (Signed)
Pt here for for right flank pain x 3 times. sts started 3 days ago. Denies any other problems.

## 2015-07-13 NOTE — ED Provider Notes (Signed)
CSN: [id number]     Arrival date & time 07/13/15  1621 History   First MD Initiated Contact with Patient 07/13/15 1629     Chief Complaint  Patient presents with  . Flank Pain     (Consider location/radiation/quality/duration/timing/severity/associated sxs/prior Treatment) HPI Comments: Patient presents to the ED with a chief complaint of right flank pain.  She states that she has had the pain for the past 3 days.  She denies any associated symptoms.  She states that it feels like a pulled muscle.  She denies any fevers, chills, nausea, vomiting, diarrhea, abdominal pain, dysuria, hematuria.  Denies any association with eating or drinking.  Has not tried taking anything for her symptoms.  States that the only reason she came to the ED was because her mother insisted in order to make certain she didn't have appendicitis.  She was initially not in the waiting room.  Had gone to the cafeteria to get some food.  The history is provided by the patient. No language interpreter was used.    History reviewed. No pertinent past medical history. Past Surgical History  Procedure Laterality Date  . Tubal ligation     History reviewed. No pertinent family history. Social History  Substance Use Topics  . Smoking status: Current Every Day Smoker  . Smokeless tobacco: None  . Alcohol Use: Yes   OB History    No data available     Review of Systems  Constitutional: Negative for fever and chills.  Respiratory: Negative for shortness of breath.   Cardiovascular: Negative for chest pain.  Gastrointestinal: Negative for nausea, vomiting, diarrhea and constipation.  Genitourinary: Positive for flank pain. Negative for dysuria.  Musculoskeletal: Positive for myalgias.  All other systems reviewed and are negative.     Allergies  Review of patient's allergies indicates no known allergies.  Home Medications   Prior to Admission medications   Medication Sig Start Date End Date Taking?  Authorizing Provider  clindamycin (CLEOCIN) 150 MG capsule Take 1 capsule (150 mg total) by mouth every 6 (six) hours. 04/19/15   Domenic Moras, PA-C  cyclobenzaprine (FLEXERIL) 10 MG tablet Take 1 tablet (10 mg total) by mouth 2 (two) times daily as needed for muscle spasms. Patient not taking: Reported on 07/17/2014 04/25/14   Noland Fordyce, PA-C  HYDROcodone-acetaminophen (NORCO/VICODIN) 5-325 MG per tablet Take 1-2 tablets by mouth every 4 (four) hours as needed for moderate pain or severe pain. 04/19/15   Domenic Moras, PA-C  ibuprofen (ADVIL,MOTRIN) 800 MG tablet Take 1 tablet (800 mg total) by mouth 3 (three) times daily. 04/19/15   Domenic Moras, PA-C  meloxicam (MOBIC) 7.5 MG tablet Take 1 tablet (7.5 mg total) by mouth daily. Patient not taking: Reported on 07/17/2014 04/25/14   Noland Fordyce, PA-C  naproxen sodium (ANAPROX) 220 MG tablet Take 220 mg by mouth daily as needed (pain).    Historical Provider, MD   BP 142/96 mmHg  Pulse 76  Temp(Src) 97.9 F (36.6 C)  Resp 16  SpO2 98% Physical Exam  Constitutional: She is oriented to person, place, and time. She appears well-developed and well-nourished.  HENT:  Head: Normocephalic and atraumatic.  Eyes: Conjunctivae and EOM are normal. Pupils are equal, round, and reactive to light.  Neck: Normal range of motion. Neck supple.  Cardiovascular: Normal rate and regular rhythm.  Exam reveals no gallop and no friction rub.   No murmur heard. Pulmonary/Chest: Effort normal and breath sounds normal. No respiratory distress. She has no  wheezes. She has no rales. She exhibits no tenderness.  Abdominal: Soft. Bowel sounds are normal. She exhibits no distension and no mass. There is no tenderness. There is no rebound and no guarding.  No focal abdominal tenderness, no RLQ tenderness or pain at McBurney's point, no RUQ tenderness or Murphy's sign, no left-sided abdominal tenderness, no fluid wave, or signs of peritonitis   Musculoskeletal: Normal range of  motion. She exhibits no edema or tenderness.  Neurological: She is alert and oriented to person, place, and time.  Skin: Skin is warm and dry.  Psychiatric: She has a normal mood and affect. Her behavior is normal. Judgment and thought content normal.  Nursing note and vitals reviewed.   ED Course  Procedures (including critical care time) Results for orders placed or performed during the hospital encounter of 07/13/15  Urinalysis, Routine w reflex microscopic (not at [facility])  Result Value Ref Range   Color, Urine YELLOW YELLOW   APPearance CLEAR CLEAR   Specific Gravity, Urine 1.023 1.005 - 1.030   pH 7.0 5.0 - 8.0   Glucose, UA NEGATIVE NEGATIVE mg/dL   Hgb urine dipstick NEGATIVE NEGATIVE   Bilirubin Urine NEGATIVE NEGATIVE   Ketones, ur NEGATIVE NEGATIVE mg/dL   Protein, ur NEGATIVE NEGATIVE mg/dL   Nitrite NEGATIVE NEGATIVE   Leukocytes, UA NEGATIVE NEGATIVE  Pregnancy, urine  Result Value Ref Range   Preg Test, Ur NEGATIVE NEGATIVE   No results found.    MDM   Final diagnoses:  Muscle strain    Patient with right flank pain.  Seems to be more musculoskeletal.  No focal abdominal tenderness.  Doubt appy, gallbladder, or KS.  Will check UA and urine preg.  UA and urine preg are negative.  Patient is very well appearing.  No evidence of acute abdomen.  VSS.  DC to home.   Patient instructed to return for:  New or worsening symptoms, including, increased abdominal pain, especially pain that localizes to one side, bloody vomit, bloody diarrhea, fever >101, and intractable vomiting.     Montine Circle, PA-C 07/13/15 2125  Davonna Belling, MD 07/13/15 [phone]

## 2015-07-13 NOTE — ED Notes (Signed)
UNable to loate PT in [place]. Pt. Called multiple times.

## 2015-07-13 NOTE — ED Notes (Signed)
Pt returns after being here earlier and leaving to go to cafeteria after triage. She c/o R side and flank pain x 3 days. She denies bowel/bladder changes, n/v.

## 2015-07-13 NOTE — Discharge Instructions (Signed)

## 2015-07-13 NOTE — ED Notes (Signed)
Called multiple times for room placement with no answer.

## 2015-07-13 NOTE — ED Notes (Signed)
Unable to locate PT in front lobby.

## 2016-02-10 ENCOUNTER — Encounter (HOSPITAL_COMMUNITY): Payer: Self-pay | Admitting: *Deleted

## 2016-02-10 ENCOUNTER — Emergency Department (HOSPITAL_COMMUNITY)
Admission: EM | Admit: 2016-02-10 | Discharge: 2016-02-11 | Disposition: A | Discharge status: Home / Self Care | Payer: Self-pay | Attending: Emergency Medicine | Admitting: Emergency Medicine

## 2016-02-10 ENCOUNTER — Emergency Department (HOSPITAL_COMMUNITY): Payer: Self-pay

## 2016-02-10 DIAGNOSIS — T148XXA Other injury of unspecified body region, initial encounter: Secondary | ICD-10-CM

## 2016-02-10 DIAGNOSIS — W1789XA Other fall from one level to another, initial encounter: Secondary | ICD-10-CM | POA: Insufficient documentation

## 2016-02-10 DIAGNOSIS — Y939 Activity, unspecified: Secondary | ICD-10-CM | POA: Insufficient documentation

## 2016-02-10 DIAGNOSIS — Z79899 Other long term (current) drug therapy: Secondary | ICD-10-CM | POA: Insufficient documentation

## 2016-02-10 DIAGNOSIS — F17200 Nicotine dependence, unspecified, uncomplicated: Secondary | ICD-10-CM | POA: Insufficient documentation

## 2016-02-10 DIAGNOSIS — L0889 Other specified local infections of the skin and subcutaneous tissue: Secondary | ICD-10-CM | POA: Insufficient documentation

## 2016-02-10 DIAGNOSIS — Y92009 Unspecified place in unspecified non-institutional (private) residence as the place of occurrence of the external cause: Secondary | ICD-10-CM | POA: Insufficient documentation

## 2016-02-10 DIAGNOSIS — L089 Local infection of the skin and subcutaneous tissue, unspecified: Secondary | ICD-10-CM

## 2016-02-10 DIAGNOSIS — Y999 Unspecified external cause status: Secondary | ICD-10-CM | POA: Insufficient documentation

## 2016-02-10 LAB — COMPREHENSIVE METABOLIC PANEL
ALK PHOS: 52 U/L (ref 38–126)
ALT: 13 U/L — AB (ref 14–54)
AST: 17 U/L (ref 15–41)
Albumin: 3.2 g/dL — ABNORMAL LOW (ref 3.5–5.0)
Anion gap: 5 (ref 5–15)
BUN: 12 mg/dL (ref 6–20)
CALCIUM: 9.5 mg/dL (ref 8.9–10.3)
CHLORIDE: 109 mmol/L (ref 101–111)
CO2: 24 mmol/L (ref 22–32)
CREATININE: 0.99 mg/dL (ref 0.44–1.00)
Glucose, Bld: 133 mg/dL — ABNORMAL HIGH (ref 65–99)
Potassium: 3.6 mmol/L (ref 3.5–5.1)
Sodium: 138 mmol/L (ref 135–145)
Total Bilirubin: 0.3 mg/dL (ref 0.3–1.2)
Total Protein: 6.2 g/dL — ABNORMAL LOW (ref 6.5–8.1)

## 2016-02-10 LAB — CBC WITH DIFFERENTIAL/PLATELET
BASOS ABS: 0 10*3/uL (ref 0.0–0.1)
Basophils Relative: 0 %
EOS PCT: 2 %
Eosinophils Absolute: 0.1 10*3/uL (ref 0.0–0.7)
HEMATOCRIT: 37.4 % (ref 36.0–46.0)
Hemoglobin: 11.9 g/dL — ABNORMAL LOW (ref 12.0–15.0)
LYMPHS ABS: 2.5 10*3/uL (ref 0.7–4.0)
LYMPHS PCT: 44 %
MCH: 29.8 pg (ref 26.0–34.0)
MCHC: 31.8 g/dL (ref 30.0–36.0)
MCV: 93.5 fL (ref 78.0–100.0)
MONO ABS: 0.4 10*3/uL (ref 0.1–1.0)
MONOS PCT: 8 %
Neutro Abs: 2.7 10*3/uL (ref 1.7–7.7)
Neutrophils Relative %: 46 %
PLATELETS: 277 10*3/uL (ref 150–400)
RBC: 4 MIL/uL (ref 3.87–5.11)
RDW: 15.1 % (ref 11.5–15.5)
WBC: 5.8 10*3/uL (ref 4.0–10.5)

## 2016-02-10 NOTE — ED Notes (Signed)
Pt in stating she fell three days ago and cut her knee, increased drainage from wound starting yesterday, swelling and redness noted, pt denies fever

## 2016-02-11 MED ORDER — CEPHALEXIN 250 MG PO CAPS
500.0000 mg | ORAL_CAPSULE | Freq: Once | ORAL | Status: AC
  Administered 2016-02-11: 500 mg via ORAL
  Filled 2016-02-11: qty 2

## 2016-02-11 MED ORDER — CEPHALEXIN 500 MG PO CAPS: 500.0000 mg | ORAL_CAPSULE | Freq: Four times a day (QID) | ORAL | Status: DC

## 2016-02-11 MED ORDER — TETANUS-DIPHTH-ACELL PERTUSSIS 5-2.5-18.5 LF-MCG/0.5 IM SUSP
0.5000 mL | Freq: Once | INTRAMUSCULAR | Status: AC
  Administered 2016-02-11: 0.5 mL via INTRAMUSCULAR
  Filled 2016-02-11: qty 0.5

## 2016-02-11 NOTE — Discharge Instructions (Signed)
Wound Care °Taking care of your wound properly can help to prevent pain and infection. It can also help your wound to heal more quickly.  °HOW TO CARE FOR YOUR WOUND  °· Take or apply over-the-counter and prescription medicines only as told by your health care provider. °· If you were prescribed antibiotic medicine, take or apply it as told by your health care provider. Do not stop using the antibiotic even if your condition improves. °· Clean the wound each day or as told by your health care provider. °¨ Wash the wound with mild soap and water. °¨ Rinse the wound with water to remove all soap. °¨ Pat the wound dry with a clean towel. Do not rub it. °· There are many different ways to close and cover a wound. For example, a wound can be covered with stitches (sutures), skin glue, or adhesive strips. Follow instructions from your health care provider about: °¨ How to take care of your wound. °¨ When and how you should change your bandage (dressing). °¨ When you should remove your dressing. °¨ Removing whatever was used to close your wound. °· Check your wound every day for signs of infection. Watch for: °¨ Redness, swelling, or pain. °¨ Fluid, blood, or pus. °· Keep the dressing dry until your health care provider says it can be removed. Do not take baths, swim, use a hot tub, or do anything that would put your wound underwater until your health care provider approves. °· Raise (elevate) the injured area above the level of your heart while you are sitting or lying down. °· Do not scratch or pick at the wound. °· Keep all follow-up visits as told by your health care provider. This is important. °SEEK MEDICAL CARE IF: °· You received a tetanus shot and you have swelling, severe pain, redness, or bleeding at the injection site. °· You have a fever. °· Your pain is not controlled with medicine. °· You have increased redness, swelling, or pain at the site of your wound. °· You have fluid, blood, or pus coming from your  wound. °· You notice a bad smell coming from your wound or your dressing. °SEEK IMMEDIATE MEDICAL CARE IF: °· You have a red streak going away from your wound. °  °This information is not intended to replace advice given to you by your health care provider. Make sure you discuss any questions you have with your health care provider. °  °Document Released: 04/28/2008 Document Revised: 12/04/2014 Document Reviewed: 07/16/2014 °Elsevier Interactive Patient Education ©2016 [employer] ° °

## 2016-02-11 NOTE — ED Notes (Signed)
Pt came to Nurse [facility] and said they are back. Pt back in waiting room.

## 2016-02-11 NOTE — ED Provider Notes (Signed)
CSN: [id number]     Arrival date & time 02/10/16  1913 History   First MD Initiated Contact with Patient 02/11/16 0130     Chief Complaint  Patient presents with  . Knee Injury     (Consider location/radiation/quality/duration/timing/severity/associated sxs/prior Treatment) HPI Comments: Patient presents for evaluation of right knee injury occurring 2 days ago. She reports she fell from her porch causing laceration to anterior knee. Now she is seeing drainage from the wound. No fever, radiating pain, or significant knee pain. She denies difficulty ambulating.   The history is provided by the patient. No language interpreter was used.    History reviewed. No pertinent past medical history. Past Surgical History  Procedure Laterality Date  . Tubal ligation     History reviewed. No pertinent family history. Social History  Substance Use Topics  . Smoking status: Current Every Day Smoker  . Smokeless tobacco: None  . Alcohol Use: Yes   OB History    No data available     Review of Systems  Constitutional: Negative for fever and chills.  Musculoskeletal:       See HPI.  Skin: Positive for wound.  Neurological: Negative.  Negative for weakness and numbness.      Allergies  Review of patient's allergies indicates no known allergies.  Home Medications   Prior to Admission medications   Medication Sig Start Date End Date Taking? Authorizing Provider  cyclobenzaprine (FLEXERIL) 10 MG tablet Take 1 tablet (10 mg total) by mouth 2 (two) times daily as needed for muscle spasms. 07/13/15   Montine Circle, PA-C   BP 137/99 mmHg  Pulse 60  Temp(Src) 97.7 F (36.5 C) (Oral)  Resp 20  Ht 5\' 6"  (1.676 m)  Wt 90.719 kg  BMI 32.30 kg/m2  SpO2 100% Physical Exam  Constitutional: She is oriented to person, place, and time. She appears well-developed and well-nourished.  Neck: Normal range of motion.  Pulmonary/Chest: Effort normal.  Musculoskeletal:  3.5 cm laceration to  anterior right knee. No surrounding erythema. There is mild purulent drainage from the wound. Minimally tender. No thigh or calf tenderness. No bleeding.   Neurological: She is alert and oriented to person, place, and time.  Skin: Skin is warm and dry.    ED Course  Procedures (including critical care time) Labs Review Labs Reviewed  CBC WITH DIFFERENTIAL/PLATELET - Abnormal; Notable for the following:    Hemoglobin 11.9 (*)    All other components within normal limits  COMPREHENSIVE METABOLIC PANEL - Abnormal; Notable for the following:    Glucose, Bld 133 (*)    Total Protein 6.2 (*)    Albumin 3.2 (*)    ALT 13 (*)    All other components within normal limits    Imaging Review Dg Knee Complete 4 Views Right  02/10/2016  CLINICAL DATA:  Right anterior knee pain with laceration after falling on steps at home 3 days ago. EXAM: RIGHT KNEE - COMPLETE 4+ VIEW COMPARISON:  02/15/2010 FINDINGS: Skin defect over the patella consistent with history of laceration. No radiopaque soft tissue foreign bodies. No evidence of acute fracture or dislocation of the right knee. No focal bone lesion or bone destruction. No significant effusion. Soft tissue calcifications are likely phleboliths. IMPRESSION: Soft tissue laceration anterior to the patella. No acute bony abnormalities. Electronically Signed   By: Lucienne Capers M.D.   On: 02/10/2016 21:13   I have personally reviewed and evaluated these images and lab results as part of my  medical decision-making.   EKG Interpretation None      MDM   Final diagnoses:  None    1. Wound infection right knee  Patient presents with wound to right knee occurring 2 days ago. Some purulent drainage without surrounding cellulitic changes. Wound is over patella and no joint communication is suspected. Will start on Keflex, update tetanus and recommend 2 day recheck.     Charlann Lange, PA-C 02/11/16 [place], DO 02/11/16 [id number]

## 2016-02-11 NOTE — ED Notes (Signed)
Pt's wound cleaned, bacitracin applied, and covered with gauze. Pt verbalized understanding of discharge instructions and follow up care.

## 2016-02-11 NOTE — ED Notes (Signed)
Called Pt to go back to RM. No answer.

## 2016-02-11 NOTE — ED Notes (Signed)
Called Pt to go back to a RM x's 2. No Answer

## 2016-07-18 ENCOUNTER — Emergency Department (HOSPITAL_COMMUNITY)
Admission: EM | Admit: 2016-07-18 | Discharge: 2016-07-18 | Disposition: A | Discharge status: Home / Self Care | Payer: Self-pay | Attending: Emergency Medicine | Admitting: Emergency Medicine

## 2016-07-18 ENCOUNTER — Encounter (HOSPITAL_COMMUNITY): Payer: Self-pay | Admitting: *Deleted

## 2016-07-18 ENCOUNTER — Emergency Department (HOSPITAL_COMMUNITY): Payer: Self-pay

## 2016-07-18 DIAGNOSIS — M542 Cervicalgia: Secondary | ICD-10-CM | POA: Insufficient documentation

## 2016-07-18 DIAGNOSIS — M25512 Pain in left shoulder: Secondary | ICD-10-CM | POA: Insufficient documentation

## 2016-07-18 DIAGNOSIS — R079 Chest pain, unspecified: Secondary | ICD-10-CM | POA: Insufficient documentation

## 2016-07-18 DIAGNOSIS — F17200 Nicotine dependence, unspecified, uncomplicated: Secondary | ICD-10-CM | POA: Insufficient documentation

## 2016-07-18 LAB — BASIC METABOLIC PANEL
Anion gap: 5 (ref 5–15)
BUN: 10 mg/dL (ref 6–20)
CALCIUM: 8.9 mg/dL (ref 8.9–10.3)
CO2: 25 mmol/L (ref 22–32)
CREATININE: 1 mg/dL (ref 0.44–1.00)
Chloride: 107 mmol/L (ref 101–111)
GFR calc Af Amer: >60 mL/min (ref >60)
GFR calc non Af Amer: >60 mL/min (ref >60)
GLUCOSE: 80 mg/dL (ref 65–99)
Potassium: 4.1 mmol/L (ref 3.5–5.1)
Sodium: 137 mmol/L (ref 135–145)

## 2016-07-18 LAB — I-STAT TROPONIN, ED
TROPONIN I, POC: 0 ng/mL (ref 0.00–0.08)
Troponin i, poc: 0 ng/mL (ref 0.00–0.08)

## 2016-07-18 LAB — CBC
HCT: 41.6 % (ref 36.0–46.0)
Hemoglobin: 13.8 g/dL (ref 12.0–15.0)
MCH: 30.3 pg (ref 26.0–34.0)
MCHC: 33.2 g/dL (ref 30.0–36.0)
MCV: 91.4 fL (ref 78.0–100.0)
PLATELETS: 264 10*3/uL (ref 150–400)
RBC: 4.55 MIL/uL (ref 3.87–5.11)
RDW: 14.6 % (ref 11.5–15.5)
WBC: 6.1 10*3/uL (ref 4.0–10.5)

## 2016-07-18 MED ORDER — METHOCARBAMOL 500 MG PO TABS
500.0000 mg | ORAL_TABLET | Freq: Two times a day (BID) | ORAL | 0 refills | Status: DC
Start: 2016-07-18 — End: 2017-12-14

## 2016-07-18 MED ORDER — NAPROXEN 500 MG PO TABS: 500.0000 mg | ORAL_TABLET | Freq: Two times a day (BID) | ORAL | 0 refills | Status: DC

## 2016-07-18 MED ORDER — CYCLOBENZAPRINE HCL 10 MG PO TABS
10.0000 mg | ORAL_TABLET | Freq: Once | ORAL | Status: AC
  Administered 2016-07-18: 10 mg via ORAL
  Filled 2016-07-18: qty 1

## 2016-07-18 MED ORDER — NITROGLYCERIN 0.4 MG SL SUBL
0.4000 mg | SUBLINGUAL_TABLET | SUBLINGUAL | Status: DC | PRN
  Administered 2016-07-18 (×3): 0.4 mg via SUBLINGUAL
  Filled 2016-07-18 (×3): qty 1

## 2016-07-18 MED ORDER — KETOROLAC TROMETHAMINE 60 MG/2ML IM SOLN
60.0000 mg | Freq: Once | INTRAMUSCULAR | Status: AC
  Administered 2016-07-18: 60 mg via INTRAMUSCULAR
  Filled 2016-07-18: qty 2

## 2016-07-18 NOTE — ED Triage Notes (Signed)
Pt is here for left neck stiffness x several days.  Not associated with any trauma, no numbness.  Pt has stiffness and soreness in left side of neck.

## 2016-07-18 NOTE — ED Notes (Signed)
Pt ambulatory at discharge. Verbalized understanding of DC teaching and walked out of department. NAD. VSS.

## 2016-07-18 NOTE — ED Notes (Signed)
EKG given to Dr Steinl °

## 2016-07-18 NOTE — ED Triage Notes (Signed)
PT reports neck pain radiates into chest..

## 2016-07-18 NOTE — ED Notes (Signed)
Ambulated pt from sub waiting tp TR05, waiting for provider.

## 2016-07-18 NOTE — ED Provider Notes (Signed)
[facility] DEPT Provider Note   CSN: [id number] Arrival date & time: 07/18/16  1243     History   Chief Complaint Chief Complaint  Patient presents with  . Chest Pain  . Neck Pain    HPI Diana Bean is a 45 y.o. female.  HPI   Straining pain left shoulder going to neck and down to chest Nagging pain Taking advil, 10 a day, advil not working so came in for evaluation Left side pinching like muscle spasm Just there 3 days, constantly Worse with moving arms Nothing else makes it worse, nonexertional  No falls, no trauma, not lifting more, thought maybe slept wrong Woke up 3 days ago, left neck, left shoulder pinching pain, back of lung  No family hx of heart disease,   Smokes No htn, no DM, no hlpd  History reviewed. No pertinent past medical history.  There are no active problems to display for this patient.   Past Surgical History:  Procedure Laterality Date  . TUBAL LIGATION      OB History    No data available       Home Medications    Prior to Admission medications   Medication Sig Start Date End Date Taking? Authorizing Provider  methocarbamol (ROBAXIN) 500 MG tablet Take 1 tablet (500 mg total) by mouth 2 (two) times daily. 07/18/16   Gareth Morgan, MD  naproxen (NAPROSYN) 500 MG tablet Take 1 tablet (500 mg total) by mouth 2 (two) times daily. 07/18/16   Gareth Morgan, MD    Family History No family history on file.  Social History Social History  Substance Use Topics  . Smoking status: Current Every Day Smoker  . Smokeless tobacco: Never Used  . Alcohol use Yes     Allergies   Patient has no known allergies.   Review of Systems Review of Systems  Constitutional: Negative for fever.  HENT: Positive for congestion and rhinorrhea. Negative for sore throat.   Eyes: Negative for visual disturbance.  Respiratory: Negative for cough and shortness of breath.   Cardiovascular: Positive for chest pain. Negative for leg swelling.   Gastrointestinal: Negative for abdominal pain, nausea and vomiting. Diarrhea: loose stool.  Genitourinary: Negative for difficulty urinating and dysuria.  Musculoskeletal: Positive for neck pain. Negative for back pain.  Skin: Negative for rash.  Neurological: Negative for syncope and headaches.     Physical Exam Updated Vital Signs BP 131/84   Pulse 82   Temp 97.7 F (36.5 C) (Oral)   Resp 18   SpO2 99%   Physical Exam  Constitutional: She is oriented to person, place, and time. She appears well-developed and well-nourished. No distress.  HENT:  Head: Normocephalic and atraumatic.  Eyes: Conjunctivae and EOM are normal.  Neck: Normal range of motion.  Cardiovascular: Normal rate, regular rhythm, normal heart sounds and intact distal pulses.  Exam reveals no gallop and no friction rub.   No murmur heard. Pulmonary/Chest: Effort normal and breath sounds normal. No respiratory distress. She has no wheezes. She has no rales.  Abdominal: Soft. She exhibits no distension. There is no tenderness. There is no guarding.  Musculoskeletal: She exhibits no edema or tenderness.  Neurological: She is alert and oriented to person, place, and time.  Skin: Skin is warm and dry. No rash noted. She is not diaphoretic. No erythema.  Nursing note and vitals reviewed.    ED Treatments / Results  Labs (all labs ordered are listed, but only abnormal results are displayed) Labs  Reviewed  BASIC METABOLIC PANEL  CBC  I-STAT [place], ED  I-STAT [place], ED    EKG  EKG Interpretation  Date/Time:  Saturday July 18 2016 13:25:47 EST Ventricular Rate:  76 PR Interval:  132 QRS Duration: 82 QT Interval:  370 QTC Calculation: 416 R Axis:   55 Text Interpretation:  Normal sinus rhythm Possible Left atrial enlargement Borderline ECG No previous ECGs available Confirmed by [facility] MD, Shanya Ferriss ([id number]) on 07/18/2016 3:08:00 PM       Radiology Dg Chest 2 View  Result Date:  07/18/2016 CLINICAL DATA:  Chest pain, neck pain EXAM: CHEST  2 VIEW COMPARISON:  03/28/2014 FINDINGS: Heart and mediastinal contours are within normal limits. No focal opacities or effusions. No acute bony abnormality. IMPRESSION: No active cardiopulmonary disease. Electronically Signed   By: Rolm Baptise M.D.   On: 07/18/2016 14:31    Procedures Procedures (including critical care time)  Medications Ordered in ED Medications  ketorolac (TORADOL) injection 60 mg (60 mg Intramuscular Given 07/18/16 1530)  cyclobenzaprine (FLEXERIL) tablet 10 mg (10 mg Oral Given 07/18/16 1530)     Initial Impression / Assessment and Plan / ED Course  I have reviewed the triage vital signs and the nursing notes.  Pertinent labs & imaging results that were available during my care of the patient were reviewed by me and considered in my medical decision making (see chart for details).  Clinical Course    45 year old female with presents with concern of chest pain. Differential diagnosis for chest pain includes pulmonary embolus, dissection, pneumothorax, pneumonia, ACS, myocarditis, pericarditis.  EKG was done and evaluate by me and showed no acute ST changes and no signs of pericarditis. Chest x-ray was done and evaluated by me and radiology and showed no sign of pneumonia or pneumothorax. Pt PERC negative, no dyspnea, doubt PE.  Delta troponin negative. Doubt ACS.  More likely msk pain given pain with arm movements. No change with nitro.  Recommend close outpt follow up. Given rx for robaxin and naparoxen. Patient discharged in stable condition with understanding of reasons to return.   Final Clinical Impressions(s) / ED Diagnoses   Final diagnoses:  Neck pain  Acute pain of left shoulder  Chest pain, unspecified type    New Prescriptions Discharge Medication List as of 07/18/2016  5:31 PM    START taking these medications   Details  methocarbamol (ROBAXIN) 500 MG tablet Take 1 tablet (500 mg  total) by mouth 2 (two) times daily., Starting Sat 07/18/2016, Print    naproxen (NAPROSYN) 500 MG tablet Take 1 tablet (500 mg total) by mouth 2 (two) times daily., Starting Sat 07/18/2016, Print         Gareth Morgan, MD 07/19/16 [phone]

## 2016-07-18 NOTE — ED Notes (Signed)
MD made aware that pt had no relief from medications.

## 2016-07-18 NOTE — ED Provider Notes (Signed)
MSE was initiated and I personally evaluated the patient and placed orders (if any) at  1:19 PM on July 18, 2016.  Diana Bean is a 45 y.o. female who presents to the ED with complaints of L neck pain x4 days that radiates into her chest and posterior ribs/thoracic back. Given CP complaint, level of acuity changes, work up initiated, and pt moved to an acute bed.    The patient appears stable so that the remainder of the MSE may be completed by another provider.   [address] [place], PA-C 07/18/16 [place], MD 07/18/16 1434

## 2016-07-18 NOTE — ED Notes (Signed)
ED Provider at bedside. 

## 2016-10-21 ENCOUNTER — Encounter (HOSPITAL_COMMUNITY): Payer: Self-pay | Admitting: Emergency Medicine

## 2016-10-21 ENCOUNTER — Emergency Department (HOSPITAL_COMMUNITY)
Admission: EM | Admit: 2016-10-21 | Discharge: 2016-10-21 | Disposition: A | Discharge status: Home / Self Care | Payer: Self-pay | Attending: Dermatology | Admitting: Dermatology

## 2016-10-21 DIAGNOSIS — R040 Epistaxis: Secondary | ICD-10-CM | POA: Insufficient documentation

## 2016-10-21 DIAGNOSIS — Z5321 Procedure and treatment not carried out due to patient leaving prior to being seen by health care provider: Secondary | ICD-10-CM | POA: Insufficient documentation

## 2016-10-21 HISTORY — DX: Essential (primary) hypertension: I10

## 2016-10-21 NOTE — ED Notes (Signed)
Patient not in waiting room  

## 2016-10-21 NOTE — ED Triage Notes (Signed)
Pt has had headache for 2 weeks with no relief. Pt states she woke up with nosebleed. Pt used to be on BP meds but took herself off.

## 2016-10-21 NOTE — ED Notes (Signed)
Pt called for room placement x 2. Pt does not answer.

## 2017-06-16 ENCOUNTER — Other Ambulatory Visit: Payer: Self-pay

## 2017-06-16 ENCOUNTER — Encounter (HOSPITAL_COMMUNITY): Payer: Self-pay | Admitting: Emergency Medicine

## 2017-06-16 ENCOUNTER — Emergency Department (HOSPITAL_COMMUNITY)
Admission: EM | Admit: 2017-06-16 | Discharge: 2017-06-17 | Disposition: A | Discharge status: Home / Self Care | Payer: Self-pay | Attending: Emergency Medicine | Admitting: Emergency Medicine

## 2017-06-16 DIAGNOSIS — Z79899 Other long term (current) drug therapy: Secondary | ICD-10-CM | POA: Insufficient documentation

## 2017-06-16 DIAGNOSIS — I10 Essential (primary) hypertension: Secondary | ICD-10-CM | POA: Insufficient documentation

## 2017-06-16 DIAGNOSIS — R1013 Epigastric pain: Secondary | ICD-10-CM | POA: Insufficient documentation

## 2017-06-16 DIAGNOSIS — F17210 Nicotine dependence, cigarettes, uncomplicated: Secondary | ICD-10-CM | POA: Insufficient documentation

## 2017-06-16 LAB — URINALYSIS, ROUTINE W REFLEX MICROSCOPIC
Bilirubin Urine: NEGATIVE
Glucose, UA: NEGATIVE mg/dL
Hgb urine dipstick: NEGATIVE
KETONES UR: NEGATIVE mg/dL
LEUKOCYTES UA: NEGATIVE
NITRITE: NEGATIVE
PH: 5 (ref 5.0–8.0)
PROTEIN: NEGATIVE mg/dL
Specific Gravity, Urine: 1.018 (ref 1.005–1.030)

## 2017-06-16 LAB — CBC
HEMATOCRIT: 39.1 % (ref 36.0–46.0)
Hemoglobin: 12.5 g/dL (ref 12.0–15.0)
MCH: 29.6 pg (ref 26.0–34.0)
MCHC: 32 g/dL (ref 30.0–36.0)
MCV: 92.7 fL (ref 78.0–100.0)
PLATELETS: 286 10*3/uL (ref 150–400)
RBC: 4.22 MIL/uL (ref 3.87–5.11)
RDW: 15.6 % — AB (ref 11.5–15.5)
WBC: 7.3 10*3/uL (ref 4.0–10.5)

## 2017-06-16 LAB — COMPREHENSIVE METABOLIC PANEL
ALT: 14 U/L (ref 14–54)
AST: 22 U/L (ref 15–41)
Albumin: 3.5 g/dL (ref 3.5–5.0)
Alkaline Phosphatase: 54 U/L (ref 38–126)
Anion gap: 7 (ref 5–15)
BILIRUBIN TOTAL: 0.4 mg/dL (ref 0.3–1.2)
BUN: 10 mg/dL (ref 6–20)
CO2: 26 mmol/L (ref 22–32)
Calcium: 9.6 mg/dL (ref 8.9–10.3)
Chloride: 105 mmol/L (ref 101–111)
Creatinine, Ser: 0.78 mg/dL (ref 0.44–1.00)
GFR calc Af Amer: >60 mL/min (ref >60)
Glucose, Bld: 141 mg/dL — ABNORMAL HIGH (ref 65–99)
POTASSIUM: 3.8 mmol/L (ref 3.5–5.1)
Sodium: 138 mmol/L (ref 135–145)
TOTAL PROTEIN: 6.7 g/dL (ref 6.5–8.1)

## 2017-06-16 LAB — LIPASE, BLOOD: Lipase: 22 U/L (ref 11–51)

## 2017-06-16 MED ORDER — GI COCKTAIL ~~LOC~~
30.0000 mL | Freq: Once | ORAL | Status: AC
  Administered 2017-06-17: 30 mL via ORAL
  Filled 2017-06-16: qty 30

## 2017-06-16 NOTE — ED Triage Notes (Signed)
Pt states she has been having abd pain for a week. Pt states she drinks 2 24 oz cans of beer every night; pt feels this is related to her drinking. Pt has been having burning with urination, back pain and a few loose stools.

## 2017-06-17 MED ORDER — FAMOTIDINE 20 MG PO TABS: 20.0000 mg | ORAL_TABLET | Freq: Two times a day (BID) | ORAL | 0 refills | Status: DC

## 2017-06-17 NOTE — ED Provider Notes (Signed)
[facility] EMERGENCY DEPARTMENT Provider Note   CSN: [id number] Arrival date & time: 06/16/17  1654     History   Chief Complaint Chief Complaint  Patient presents with  . Abdominal Pain    HPI Diana Bean is a 46 y.o. female with past medical history of hypertension, presenting to the ED with 1 week of intermittent epigastric abdominal pain described as a burning sensation.  Patient states she also has heartburn. Reports one soft stool today, nonbloody, no melena. Also reports an episode of dysuria today. She denies N/V, F/C, melena, vaginal bleeding or discharge, urinary frequency, or any other complaints.  No medications tried for her symptoms.  Reports she drinks two 24 ounce cans of beer every night and has been doing that for some time. Questions if this is the cause of her symptoms. History of tubal ligation, no other history of abdominal surgeries.   The history is provided by the patient.    Past Medical History:  Diagnosis Date  . Hypertension     There are no active problems to display for this patient.   Past Surgical History:  Procedure Laterality Date  . TUBAL LIGATION      OB History    No data available       Home Medications    Prior to Admission medications   Medication Sig Start Date End Date Taking? Authorizing Provider  famotidine (PEPCID) 20 MG tablet Take 1 tablet (20 mg total) 2 (two) times daily by mouth. 06/17/17   Chidinma Clites, [place] N, PA-C  methocarbamol (ROBAXIN) 500 MG tablet Take 1 tablet (500 mg total) by mouth 2 (two) times daily. 07/18/16   Gareth Morgan, MD  naproxen (NAPROSYN) 500 MG tablet Take 1 tablet (500 mg total) by mouth 2 (two) times daily. 07/18/16   Gareth Morgan, MD    Family History History reviewed. No pertinent family history.  Social History Social History   Tobacco Use  . Smoking status: Current Every Day Smoker    Types: Cigarettes  . Smokeless tobacco: Never Used  Substance Use  Topics  . Alcohol use: Yes    Comment: 2, 24 oz cans of beer daily  . Drug use: Yes    Types: Marijuana     Allergies   Patient has no known allergies.   Review of Systems Review of Systems  Constitutional: Negative for appetite change, chills and fever.  Respiratory: Negative for shortness of breath.   Cardiovascular: Negative for chest pain.  Gastrointestinal: Positive for abdominal pain. Negative for blood in stool, constipation, nausea and vomiting.  Genitourinary: Positive for dysuria. Negative for frequency, vaginal bleeding and vaginal discharge.  All other systems reviewed and are negative.    Physical Exam Updated Vital Signs BP 125/85   Pulse 82   Temp 98.3 F (36.8 C) (Oral)   Resp 18   Ht 5\' 6"  (1.676 m)   Wt 83.5 kg (184 lb)   SpO2 99%   BMI 29.70 kg/m   Physical Exam  Constitutional: She appears well-developed and well-nourished.  Non-toxic appearance. She does not appear ill. No distress.  HENT:  Head: Normocephalic and atraumatic.  Mouth/Throat: Oropharynx is clear and moist.  Eyes: Conjunctivae are normal.  Cardiovascular: Normal rate, regular rhythm, normal heart sounds and intact distal pulses.  Pulmonary/Chest: Effort normal and breath sounds normal.  Abdominal: Soft. Normal appearance and bowel sounds are normal. She exhibits no distension and no mass. There is tenderness in the epigastric area and left  upper quadrant. There is no rigidity, no rebound, no guarding and negative Murphy's sign.  Neurological: She is alert.  Skin: Skin is warm.  Psychiatric: She has a normal mood and affect. Her behavior is normal.  Nursing note and vitals reviewed.    ED Treatments / Results  Labs (all labs ordered are listed, but only abnormal results are displayed) Labs Reviewed  COMPREHENSIVE METABOLIC PANEL - Abnormal; Notable for the following components:      Result Value   Glucose, Bld 141 (*)    All other components within normal limits  CBC -  Abnormal; Notable for the following components:   RDW 15.6 (*)    All other components within normal limits  URINALYSIS, ROUTINE W REFLEX MICROSCOPIC - Abnormal; Notable for the following components:   APPearance HAZY (*)    All other components within normal limits  LIPASE, BLOOD    EKG  EKG Interpretation None       Radiology No results found.  Procedures Procedures (including critical care time)  Medications Ordered in ED Medications  gi cocktail (Maalox,Lidocaine,Donnatal) (30 mLs Oral Given 06/17/17 0001)     Initial Impression / Assessment and Plan / ED Course  I have reviewed the triage vital signs and the nursing notes.  Pertinent labs & imaging results that were available during my care of the patient were reviewed by me and considered in my medical decision making (see chart for details).     Patient presenting with intermittent symptoms of epigastric discomfort described as burning, with heartburn. Suspect gastritis. Patient is nontoxic, nonseptic appearing, in no apparent distress.  Patient's pain with improvement after GI cocktail. No peritoneal signs, neg Murphy.  Labs within normal limits. Patient does not meet the SIRS or Sepsis criteria.  No indication of appendicitis, bowel obstruction, bowel perforation, cholecystitis, diverticulitis. Patient discharged home with symptomatic treatment and recommended to f/u with her PCP.  Pt safe for discharge.  Patient discussed with Dr. Dayna Barker, who agrees with care plan.  Discussed results, findings, treatment and follow up. Patient advised of return precautions. Patient verbalized understanding and agreed with plan. Final Clinical Impressions(s) / ED Diagnoses   Final diagnoses:  Epigastric abdominal pain    ED Discharge Orders        Ordered    famotidine (PEPCID) 20 MG tablet  2 times daily     06/17/17 0032       Laysa Kimmey, [place] N, PA-C 06/17/17 9678    Merrily Pew, MD 06/19/17 0130

## 2017-06-17 NOTE — Discharge Instructions (Signed)
Please read instructions below. You can take pepcid every 12 hours as needed for stomach upset.  Alcohol, caffeine, acidic foods, and NSAIDs (such as advil or aleve), can irritate your stomach lining.  Return to the ER for new or worsening symptoms.

## 2017-11-28 ENCOUNTER — Emergency Department (HOSPITAL_COMMUNITY): Admission: EM | Admit: 2017-11-28 | Discharge: 2017-11-28 | Discharge status: Absent without leave | Payer: Self-pay

## 2017-11-28 NOTE — ED Notes (Signed)
Pt checked in and told registration she had to go get her car. Pt called from lobby for triage with no responce

## 2017-11-28 NOTE — ED Notes (Signed)
Pt called for triage with no responce

## 2017-12-14 ENCOUNTER — Encounter: Payer: Self-pay | Admitting: Internal Medicine

## 2017-12-14 ENCOUNTER — Ambulatory Visit: Payer: Self-pay | Admitting: Internal Medicine

## 2017-12-14 VITALS — BP 142/98 | HR 62 | Resp 12 | Ht 64.0 in | Wt 185.0 lb

## 2017-12-14 DIAGNOSIS — E669 Obesity, unspecified: Secondary | ICD-10-CM

## 2017-12-14 DIAGNOSIS — M7061 Trochanteric bursitis, right hip: Secondary | ICD-10-CM

## 2017-12-14 DIAGNOSIS — I10 Essential (primary) hypertension: Secondary | ICD-10-CM

## 2017-12-14 DIAGNOSIS — K029 Dental caries, unspecified: Secondary | ICD-10-CM | POA: Insufficient documentation

## 2017-12-14 DIAGNOSIS — Z6831 Body mass index (BMI) 31.0-31.9, adult: Secondary | ICD-10-CM

## 2017-12-14 DIAGNOSIS — E041 Nontoxic single thyroid nodule: Secondary | ICD-10-CM

## 2017-12-14 MED ORDER — NAPROXEN 500 MG PO TABS: 500.0000 mg | ORAL_TABLET | Freq: Two times a day (BID) | ORAL | 4 refills | Status: DC

## 2017-12-14 MED ORDER — HYDROCHLOROTHIAZIDE 12.5 MG PO TABS: 12.5000 mg | ORAL_TABLET | Freq: Every day | ORAL | 11 refills | Status: AC

## 2017-12-14 NOTE — Patient Instructions (Addendum)
Drink a glass of water before every meal Drink 6-8 glasses of water daily Eat three meals daily Eat a protein and healthy fat with every meal (eggs,fish, chicken, [place] and limit red meats) Eat 5 servings of vegetables daily, mix the colors Eat 2 servings of fruit daily with skin, if skin is edible Use smaller plates Put food/utensils down as you chew and swallow each bite Eat at a table with friends/family at least once daily, no TV Do not eat in front of the TV  Recent studies show that people who consume all of their calories in a 12 hour period lose weight more efficiently.  For example, if you eat your first meal at 7:00 a.m., your last meal of the day should be completed by 7:00 p.m.  For your thigh pain:  Do stretches twice daily for 20 minutes. Take Naproxen 500 mg twice daily regularly for 2 weeks, then only when needed.   If no improvement in 2 weeks, I will refer you to [facility] where you will get PT for free.

## 2017-12-14 NOTE — Progress Notes (Signed)
Patient ID: Diana Bean, female   DOB: 04/15/1971, 47 y.o.   MRN: [id number]  LCSW completed new pt screening with patient. Pt reported no current mental health symptoms and very low levels of stress. She reported no needs with housing, food, transportation, or interpersonal violence. LCSW provided contact information and info about social work services available. No follow up needed at this time.

## 2017-12-14 NOTE — Progress Notes (Signed)
Subjective:    Patient ID: Diana Bean, female    DOB: 12-Oct-1970, 47 y.o.   MRN: [id number]  HPI   Here to establish  1.  Right knee pain:  Has had problems with knee since 2017 after a fall.  Tripped and came down on front of knee.  Did have a wound in the overlying skin. Past 2-3 weeks, has had  Pain from lateral aspect of lower thigh to upper thigh.  Describes the pain as a muscular cramping pain.   Taking Advil 800 mg every 2 hours.  Taking about 3 times daily and with food.  Does help. Hurts when lying on that side--her usual sleep position is on the right side.  When awakens, is in a lot of pain.   Walks a lot, but not anymore recently than usual She feels all the rain recently has caused increased pain.  2.  Essential Hypertension:  Diagnosed in 2008. Never had her own medication prescribed.  She has been taking her cousin's HCTZ and some other "water pill".    3.  Dental pain:  Has had years' of broken teeth and cavities.  Current Meds  Medication Sig  . ibuprofen (ADVIL,MOTRIN) 200 MG tablet Take 200 mg by mouth every 6 (six) hours as needed.    No Known Allergies   Past Medical History:  Diagnosis Date  . Hypertension 2008   Past Surgical History:  Procedure Laterality Date  . TUBAL LIGATION       Social History   Socioeconomic History  . Marital status: Single    Spouse name: Not on file  . Number of children: 2  . Years of education: working on [employer]  . Highest education level: 10th grade  Occupational History  . Occupation: history of housekeeping.    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  Daughter has SSE for MS--wheelchair bound  Social Needs  . Financial resource strain: Not very hard  . Food insecurity:    Worry: Never true    Inability: Never true  . Transportation needs:    Medical: No    Non-medical: No  Tobacco Use  . Smoking status: Current Some Day Smoker    Years: 29.00    Types: Cigarettes  . Smokeless tobacco: Never Used  . Tobacco  comment: smokes maybe 10 cigs per month.  Substance and Sexual Activity  . Alcohol use: Yes    Comment: 40 oz weekly or a 6 pack of beer once weekly  . Drug use: Yes    Frequency: 7.0 times per week    Types: Marijuana  . Sexual activity: Yes    Birth control/protection: Surgical  Lifestyle  . Physical activity:    Days per week: 7 days    Minutes per session: 60 min  . Stress: Only a little  Relationships  . Social connections:    Talks on phone: Not on file    Gets together: Not on file    Attends religious service: Not on file    Active member of club or organization: Not on file    Attends meetings of clubs or organizations: Not on file    Relationship status: Never married  . Intimate partner violence:    Fear of current or ex partner: No    Emotionally abused: No    Physically abused: No    Forced sexual activity: No  Other Topics Concern  . Not on file  Social History Narrative   Lives just [place] on  [address].   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 Multiple sclerosis Daughter         Review of Systems     Objective:   Physical Exam NAD HEENT:  PERRL, EOMI, throat without injection.  Several broken, caried teeth and missing teeth. Neck:  Supple, No adenopathy.  Right 1 cm or larger thyroid nodule, quite firm. Chest:  CTA CV:  RRR with normal S1 and S2, No S3, S4 or murmur. Radial and DP pulses normal and equal.   Abd:  S, NT, No HSM or mass, + BS LE:  No edema.  Tenderness without palpable mass in upper lateral right thigh that reproduces her lateral thigh pain.  A bit less tenderness over greater trochanter on right.       Assessment & Plan:  1.  Essential Hypertension:  CMP, CBC, TSH:  HCTZ 12.5 mg daily.  Follow up in 2 weeks for BP and Pulse with BMP. To work on diet/physical activity to get weight down as well as BP. Dietary recommendations  given.  2.  Obesity:  As above.  3.  Right thyroid nodule:  TSH.  Order for thyroid ultrasound to assess, but will hold until can get orange card.  To stop in and let us know when she obtains to send order.  4.  Dental Decay:  Dental referral.  5.  Refuses [facility] referral for [facility]. Encouraged her to get involved with neighborhood association to give voice for those living with a disability and getting around neighborhood.

## 2017-12-15 LAB — COMPREHENSIVE METABOLIC PANEL
ALK PHOS: 62 IU/L (ref 39–117)
ALT: 12 IU/L (ref 0–32)
AST: 16 IU/L (ref 0–40)
Albumin/Globulin Ratio: 1.5 (ref 1.2–2.2)
Albumin: 4.1 g/dL (ref 3.5–5.5)
BILIRUBIN TOTAL: 0.4 mg/dL (ref 0.0–1.2)
BUN/Creatinine Ratio: 16 (ref 9–23)
BUN: 13 mg/dL (ref 6–24)
CHLORIDE: 104 mmol/L (ref 96–106)
CO2: 28 mmol/L (ref 20–29)
CREATININE: 0.79 mg/dL (ref 0.57–1.00)
Calcium: 9.6 mg/dL (ref 8.7–10.2)
GFR calc Af Amer: 102 mL/min/{1.73_m2} (ref >59)
GFR calc non Af Amer: 88 mL/min/{1.73_m2} (ref >59)
Globulin, Total: 2.8 g/dL (ref 1.5–4.5)
Glucose: 56 mg/dL — ABNORMAL LOW (ref 65–99)
Potassium: 4.9 mmol/L (ref 3.5–5.2)
Sodium: 142 mmol/L (ref 134–144)
Total Protein: 6.9 g/dL (ref 6.0–8.5)

## 2017-12-15 LAB — CBC WITH DIFFERENTIAL/PLATELET
Basophils Absolute: 0.1 10*3/uL (ref 0.0–0.2)
Basos: 1 %
EOS (ABSOLUTE): 0.2 10*3/uL (ref 0.0–0.4)
EOS: 3 %
HEMATOCRIT: 41.3 % (ref 34.0–46.6)
HEMOGLOBIN: 13.2 g/dL (ref 11.1–15.9)
IMMATURE GRANULOCYTES: 0 %
Immature Grans (Abs): 0 10*3/uL (ref 0.0–0.1)
Lymphocytes Absolute: 2.4 10*3/uL (ref 0.7–3.1)
Lymphs: 38 %
MCH: 29.7 pg (ref 26.6–33.0)
MCHC: 32 g/dL (ref 31.5–35.7)
MCV: 93 fL (ref 79–97)
MONOCYTES: 6 %
MONOS ABS: 0.3 10*3/uL (ref 0.1–0.9)
NEUTROS PCT: 52 %
Neutrophils Absolute: 3.2 10*3/uL (ref 1.4–7.0)
Platelets: 298 10*3/uL (ref 150–379)
RBC: 4.45 x10E6/uL (ref 3.77–5.28)
RDW: 15.5 % — AB (ref 12.3–15.4)
WBC: 6.2 10*3/uL (ref 3.4–10.8)

## 2017-12-15 LAB — TSH: TSH: 0.949 u[IU]/mL (ref 0.450–4.500)

## 2017-12-19 ENCOUNTER — Emergency Department (HOSPITAL_COMMUNITY)
Admission: EM | Admit: 2017-12-19 | Discharge: 2017-12-19 | Disposition: A | Discharge status: Home / Self Care | Payer: Self-pay | Attending: Emergency Medicine | Admitting: Emergency Medicine

## 2017-12-19 ENCOUNTER — Other Ambulatory Visit: Payer: Self-pay

## 2017-12-19 DIAGNOSIS — I10 Essential (primary) hypertension: Secondary | ICD-10-CM | POA: Insufficient documentation

## 2017-12-19 DIAGNOSIS — Z859 Personal history of malignant neoplasm, unspecified: Secondary | ICD-10-CM | POA: Insufficient documentation

## 2017-12-19 DIAGNOSIS — M5431 Sciatica, right side: Secondary | ICD-10-CM | POA: Insufficient documentation

## 2017-12-19 DIAGNOSIS — F17210 Nicotine dependence, cigarettes, uncomplicated: Secondary | ICD-10-CM | POA: Insufficient documentation

## 2017-12-19 DIAGNOSIS — Z79899 Other long term (current) drug therapy: Secondary | ICD-10-CM | POA: Insufficient documentation

## 2017-12-19 DIAGNOSIS — M25561 Pain in right knee: Secondary | ICD-10-CM | POA: Insufficient documentation

## 2017-12-19 DIAGNOSIS — G8929 Other chronic pain: Secondary | ICD-10-CM | POA: Insufficient documentation

## 2017-12-19 LAB — URINALYSIS, ROUTINE W REFLEX MICROSCOPIC
Bilirubin Urine: NEGATIVE
GLUCOSE, UA: NEGATIVE mg/dL
Hgb urine dipstick: NEGATIVE
Ketones, ur: 5 mg/dL — AB
LEUKOCYTES UA: NEGATIVE
NITRITE: NEGATIVE
PH: 5 (ref 5.0–8.0)
Protein, ur: NEGATIVE mg/dL
Specific Gravity, Urine: 1.03 (ref 1.005–1.030)

## 2017-12-19 MED ORDER — NAPROXEN 500 MG PO TABS: 500.0000 mg | ORAL_TABLET | Freq: Two times a day (BID) | ORAL | 0 refills | Status: DC

## 2017-12-19 MED ORDER — METHOCARBAMOL 500 MG PO TABS: 500.0000 mg | ORAL_TABLET | Freq: Two times a day (BID) | ORAL | 0 refills | Status: DC

## 2017-12-19 MED ORDER — KETOROLAC TROMETHAMINE 30 MG/ML IJ SOLN
15.0000 mg | Freq: Once | INTRAMUSCULAR | Status: AC
  Administered 2017-12-19: 15 mg via INTRAMUSCULAR
  Filled 2017-12-19: qty 1

## 2017-12-19 MED ORDER — METHYLPREDNISOLONE 4 MG PO TBPK: ORAL_TABLET | ORAL | 7 refills | Status: DC

## 2017-12-19 NOTE — ED Triage Notes (Signed)
Pt reports pain on her right leg and knee. Pt reports it feels like a "nerve pain going down to her toes from her hip" Pt reports she has never heard of sciatica.  Pt also reports swelling and pain to her right knee and states she fell a year ago and would like a knee sleeve for the swelling.

## 2017-12-19 NOTE — Discharge Instructions (Signed)
Please take Naprosyn twice daily, complete Medrol Dosepak as directed, you may use Robaxin as needed, this medication can cause drowsiness do not combine with alcohol and do not take before driving.  Your symptoms are likely caused by sciatic nerve inflammation, you may also do the exercises provided.  For additional pain relief you can try muscle cream such as Biofreeze.  It is extremely important that you follow-up with your primary care doctor for continued management.  Return to the emergency department for loss of bowel or bladder control, numbness or weakness in the leg, inability to walk, fevers, abdominal pain or any other new or concerning symptoms.

## 2017-12-19 NOTE — ED Provider Notes (Signed)
[facility] DEPT Provider Note   CSN: [id number] Arrival date & time: 12/19/17  2026     History   Chief Complaint Chief Complaint  Patient presents with  . Knee Pain    HPI Diana Bean is a 47 y.o. female.  Diana Bean is a 47 y.o. Female  with history of hypertension, who presents to the emergency department for evaluation of that starts in her right back and hip and radiates all the way down to her toes.  Patient reports pain has been happening intermittently for the last month, but becoming more persistent.  Patient reports pain has a shooting sensation, is worse with movement and forward bending.  Patient denies any inciting injury or fall.  Patient has tried Naprosyn, but has not been taking it consistently has not tried anything else to treat her symptoms.  Patient denies any numbness or weakness in the leg, the right leg.  No difficulty walking although painful.  No fevers or chills, no history patient reports some occasional nausea.  Denies abdominal pain.  No loss of bowel or bladder fall, constipation.  Patient reports she has chronic pain in the right knee that is been exacerbated during this, and request something to help support her knee.     Past Medical History:  Diagnosis Date  . Hypertension 2008    Patient Active Problem List   Diagnosis Date Noted  . Dental decay 12/14/2017  . Class 1 obesity with serious comorbidity and body mass index (BMI) of 31.0 to 31.9 in adult 12/14/2017  . Hypertension 08/03/2006    Past Surgical History:  Procedure Laterality Date  . TUBAL LIGATION       OB History   None      Home Medications    Prior to Admission medications   Medication Sig Start Date End Date Taking? Authorizing Provider  hydrochlorothiazide (HYDRODIURIL) 12.5 MG tablet Take 1 tablet (12.5 mg total) by mouth daily. 12/14/17   Mack Hook, MD  ibuprofen (ADVIL,MOTRIN) 200 MG tablet Take 200 mg by mouth every 6  (six) hours as needed.    [provider]  naproxen (NAPROSYN) 500 MG tablet Take 1 tablet (500 mg total) by mouth 2 (two) times daily with a meal. 12/14/17   Mack Hook, MD    Family History Family History  Problem Relation Age of Onset  . Multiple sclerosis Daughter     Social History Social History   Tobacco Use  . Smoking status: Current Some Day Smoker    Years: 29.00    Types: Cigarettes  . Smokeless tobacco: Never Used  . Tobacco comment: smokes maybe 10 cigs per month.  Substance Use Topics  . Alcohol use: Yes    Comment: 40 oz weekly or a 6 pack of beer once weekly  . Drug use: Yes    Frequency: 7.0 times per week    Types: Marijuana     Allergies   Patient has no known allergies.   Review of Systems Review of Systems  Constitutional: Negative for chills and fever.  Eyes: Negative for visual disturbance.  Respiratory: Negative for shortness of breath.   Cardiovascular: Negative for chest pain.  Gastrointestinal: Negative for abdominal pain, blood in stool, constipation, diarrhea, nausea and vomiting.  Genitourinary: Positive for dysuria and frequency. Negative for flank pain and hematuria.  Musculoskeletal: Positive for arthralgias and back pain. Negative for gait problem, joint swelling, neck pain and neck stiffness.  Skin: Negative for color change and rash.  Neurological: Negative for dizziness, weakness, numbness and headaches.     Physical Exam Updated Vital Signs BP 136/81 (BP Location: Left Arm)   Pulse 84   Temp 97.7 F (36.5 C) (Oral)   Resp 18   Ht 5\' 5"  (1.651 m)   Wt 84.8 kg (187 lb)   LMP  (LMP Unknown)   SpO2 99%   BMI 31.12 kg/m   Physical Exam  Constitutional: She is oriented to person, place, and time. She appears well-developed and well-nourished. No distress.  HENT:  Head: Normocephalic and atraumatic.  Eyes: Right eye exhibits no discharge. Left eye exhibits no discharge.  Cardiovascular: Normal rate,  regular rhythm, normal heart sounds and intact distal pulses.  Pulmonary/Chest: Effort normal and breath sounds normal. No respiratory distress.  Respirations equal and unlabored, patient able to speak in full sentences, lungs clear to auscultation bilaterally  Abdominal: Soft. Bowel sounds are normal. She exhibits no distension and no mass. There is no tenderness. There is no guarding.  Abdomen soft, nondistended, nontender to palpation in all quadrants without guarding or peritoneal signs  Musculoskeletal:  No midline elbow tenderness, there is tenderness over the right side of the lower back without any focal palpable deformity or overlying erythema.  Positive straight leg raise on the right. Right knee without any appreciable swelling, no overlying erythema or warmth, patient is able to fully Flex the knee without issues.  Bilateral DP and TP pulses 2+  Neurological: She is alert and oriented to person, place, and time. Coordination normal.  Speech is clear, able to follow commands CN III-XII intact Normal strength in upper and lower extremities bilaterally including dorsiflexion and plantar flexion, strong and equal grip strength Sensation normal to light and sharp touch Moves extremities without ataxia, coordination intact  Skin: She is not diaphoretic.  Psychiatric: She has a normal mood and affect. Her behavior is normal.  Nursing note and vitals reviewed.    ED Treatments / Results  Labs (all labs ordered are listed, but only abnormal results are displayed) Labs Reviewed - No data to display  EKG None  Radiology No results found.  Procedures Procedures (including critical care time)  Medications Ordered in ED Medications  ketorolac (TORADOL) 30 MG/ML injection 15 mg (15 mg Intramuscular Given 12/19/17 2205)     Initial Impression / Assessment and Plan / ED Course  I have reviewed the triage vital signs and the nursing notes.  Pertinent labs & imaging results that  were available during my care of the patient were reviewed by me and considered in my medical decision making (see chart for details).  Normal neurological exam, no evidence of urinary incontinence or retention, pain is consistently reproducible. There is no evidence of AAA or concern for dissection at this time.   Patient can walk but states is painful.  No loss of bowel or bladder control.  No concern for cauda equina.  No fever, night sweats, weight loss, h/o cancer, IVDU.  Patient also with worsening of her chronic right knee pain, exam not concerning for infection, range of motion intact, and right lower extremity is neurovascularly intact, no new acute injury so do not feel that imaging is necessary, will provide patient with knee sleeve.  Pain treated here in the department with adequate improvement. RICE protocol and pain medicine indicated and discussed with patient. I have also discussed reasons to return immediately to the ER.  Patient expresses understanding and agrees with plan.  Final Clinical Impressions(s) / ED Diagnoses  Final diagnoses:  Sciatica of right side  Chronic pain of right knee    ED Discharge Orders        Ordered    naproxen (NAPROSYN) 500 MG tablet  2 times daily     12/19/17 2154    methylPREDNISolone (MEDROL DOSEPAK) 4 MG TBPK tablet     12/19/17 2154    methocarbamol (ROBAXIN) 500 MG tablet  2 times daily     12/19/17 2154       Jacqlyn Larsen, PA-C 12/20/17 0046    Quintella Reichert, MD 12/22/17 1236

## 2017-12-24 ENCOUNTER — Ambulatory Visit: Payer: Self-pay | Admitting: Internal Medicine

## 2017-12-28 ENCOUNTER — Ambulatory Visit: Payer: Self-pay

## 2017-12-28 VITALS — BP 124/82 | HR 72

## 2017-12-28 DIAGNOSIS — I10 Essential (primary) hypertension: Secondary | ICD-10-CM

## 2017-12-28 NOTE — Progress Notes (Signed)
Patient informed to continue current dose of medication. Patient verbalized understanding. Dr. Amil Amen agrees.

## 2017-12-30 ENCOUNTER — Other Ambulatory Visit: Payer: Self-pay

## 2017-12-30 ENCOUNTER — Encounter (HOSPITAL_COMMUNITY): Payer: Self-pay | Admitting: *Deleted

## 2017-12-30 ENCOUNTER — Emergency Department (HOSPITAL_COMMUNITY)
Admission: EM | Admit: 2017-12-30 | Discharge: 2017-12-30 | Disposition: A | Discharge status: Home / Self Care | Payer: Self-pay | Attending: Physician Assistant | Admitting: Physician Assistant

## 2017-12-30 DIAGNOSIS — Y998 Other external cause status: Secondary | ICD-10-CM | POA: Insufficient documentation

## 2017-12-30 DIAGNOSIS — Y929 Unspecified place or not applicable: Secondary | ICD-10-CM | POA: Insufficient documentation

## 2017-12-30 DIAGNOSIS — S39012A Strain of muscle, fascia and tendon of lower back, initial encounter: Secondary | ICD-10-CM | POA: Insufficient documentation

## 2017-12-30 DIAGNOSIS — S39012D Strain of muscle, fascia and tendon of lower back, subsequent encounter: Secondary | ICD-10-CM

## 2017-12-30 DIAGNOSIS — X58XXXA Exposure to other specified factors, initial encounter: Secondary | ICD-10-CM | POA: Insufficient documentation

## 2017-12-30 DIAGNOSIS — I10 Essential (primary) hypertension: Secondary | ICD-10-CM | POA: Insufficient documentation

## 2017-12-30 DIAGNOSIS — M25551 Pain in right hip: Secondary | ICD-10-CM

## 2017-12-30 DIAGNOSIS — M25552 Pain in left hip: Secondary | ICD-10-CM | POA: Insufficient documentation

## 2017-12-30 DIAGNOSIS — F17210 Nicotine dependence, cigarettes, uncomplicated: Secondary | ICD-10-CM | POA: Insufficient documentation

## 2017-12-30 DIAGNOSIS — Y9389 Activity, other specified: Secondary | ICD-10-CM | POA: Insufficient documentation

## 2017-12-30 MED ORDER — CYCLOBENZAPRINE HCL 10 MG PO TABS: 10.0000 mg | ORAL_TABLET | Freq: Two times a day (BID) | ORAL | 0 refills | Status: DC | PRN

## 2017-12-30 MED ORDER — IBUPROFEN 800 MG PO TABS: 800.0000 mg | ORAL_TABLET | Freq: Three times a day (TID) | ORAL | 0 refills | Status: DC

## 2017-12-30 MED ORDER — KETOROLAC TROMETHAMINE 60 MG/2ML IM SOLN
60.0000 mg | Freq: Once | INTRAMUSCULAR | Status: AC
  Administered 2017-12-30: 60 mg via INTRAMUSCULAR
  Filled 2017-12-30: qty 2

## 2017-12-30 MED ORDER — CYCLOBENZAPRINE HCL 10 MG PO TABS
10.0000 mg | ORAL_TABLET | Freq: Once | ORAL | Status: AC
  Administered 2017-12-30: 10 mg via ORAL
  Filled 2017-12-30: qty 1

## 2017-12-30 NOTE — ED Provider Notes (Signed)
[facility] EMERGENCY DEPARTMENT Provider Note   CSN: [id number] Arrival date & time: 12/30/17  1844     History   Chief Complaint Chief Complaint  Patient presents with  . Back Pain  . Leg Pain    HPI Diana Bean is a 47 y.o. female with a history of hypertension and obesity who presents to the emergency department with a chief complaint of back pain for the last few weeks.  The patient states that she was previously diagnosed with sciatica and states that she thinks that she is having a flare.  She was seen in the emergency department on May 19 for the same symptoms and was discharged home with prednisone, Robaxin, and naproxen.  She has been taking these medications as prescribed and reports that her symptoms have somewhat improved, " but they just will not go away."  She characterizes her pain as tightness in her bilateral hips that radiates down to the bilateral knees.  She denies urinary or fecal incontinence, fever, chills, numbness, weakness, or redness, pain, or swelling in the bilateral hips.  No known trauma or injury.  She denies new exercises or activities.  She has been ambulatory without difficulty.  She denies dysuria, hematuria, or urinary frequency or hesitancy.  She was advised to follow-up with her PCP after her last ED visit.  She has made an appointment, but is unable to be seen until the end of July.  The history is provided by the patient. No language interpreter was used.    Past Medical History:  Diagnosis Date  . Hypertension 2008    Patient Active Problem List   Diagnosis Date Noted  . Dental decay 12/14/2017  . Class 1 obesity with serious comorbidity and body mass index (BMI) of 31.0 to 31.9 in adult 12/14/2017  . Hypertension 08/03/2006    Past Surgical History:  Procedure Laterality Date  . TUBAL LIGATION       OB History   None      Home Medications    Prior to Admission medications   Medication Sig Start Date  End Date Taking? Authorizing Provider  cyclobenzaprine (FLEXERIL) 10 MG tablet Take 1 tablet (10 mg total) by mouth 2 (two) times daily as needed for muscle spasms. 12/30/17   McDonald, Mia A, PA-C  hydrochlorothiazide (HYDRODIURIL) 12.5 MG tablet Take 1 tablet (12.5 mg total) by mouth daily. 12/14/17   Mack Hook, MD  ibuprofen (ADVIL,MOTRIN) 800 MG tablet Take 1 tablet (800 mg total) by mouth 3 (three) times daily. 12/30/17   McDonald, Mia A, PA-C  methocarbamol (ROBAXIN) 500 MG tablet Take 1 tablet (500 mg total) by mouth 2 (two) times daily. 12/19/17   Jacqlyn Larsen, PA-C  methylPREDNISolone (MEDROL DOSEPAK) 4 MG TBPK tablet Take as directed 12/19/17   Jacqlyn Larsen, PA-C  naproxen (NAPROSYN) 500 MG tablet Take 1 tablet (500 mg total) by mouth 2 (two) times daily. 12/19/17   Jacqlyn Larsen, PA-C    Family History Family History  Problem Relation Age of Onset  . Multiple sclerosis Daughter     Social History Social History   Tobacco Use  . Smoking status: Current Some Day Smoker    Years: 29.00    Types: Cigarettes  . Smokeless tobacco: Never Used  . Tobacco comment: smokes maybe 10 cigs per month.  Substance Use Topics  . Alcohol use: Yes    Comment: 40 oz weekly or a 6 pack of beer once weekly  .  Drug use: Yes    Frequency: 7.0 times per week    Types: Marijuana     Allergies   Patient has no known allergies.   Review of Systems Review of Systems  Constitutional: Negative for activity change, chills and fever.  Respiratory: Negative for shortness of breath.   Cardiovascular: Negative for chest pain.  Gastrointestinal: Negative for abdominal pain.  Genitourinary: Negative for dysuria.  Musculoskeletal: Positive for arthralgias and myalgias. Negative for back pain, gait problem, joint swelling, neck pain and neck stiffness.  Skin: Negative for color change and rash.  Allergic/Immunologic: Negative for immunocompromised state.  Neurological: Negative for  dizziness, weakness, numbness and headaches.  Psychiatric/Behavioral: Negative for confusion.     Physical Exam Updated Vital Signs BP (!) 141/105   Pulse 77   Temp 98.1 F (36.7 C) (Oral)   Resp 16   LMP  (LMP Unknown)   SpO2 99%   Physical Exam  Constitutional: No distress.  HENT:  Head: Normocephalic.  Eyes: Conjunctivae are normal.  Neck: Neck supple.  Cardiovascular: Normal rate, regular rhythm, normal heart sounds and intact distal pulses. Exam reveals no gallop and no friction rub.  No murmur heard. Pulmonary/Chest: Effort normal. No stridor. No respiratory distress. She has no wheezes. She has no rales. She exhibits no tenderness.  Abdominal: Soft. She exhibits no distension.  Musculoskeletal: Normal range of motion. She exhibits tenderness. She exhibits no edema or deformity.  No tenderness to palpation to the cervical, thoracic, or lumbar spinous processes.  No tenderness to the cervical or thoracic paraspinal muscles.  She is diffusely tender over the bilateral lumbar paraspinal muscles and musculature of the bilateral hips.  No muscle spasms on exam.  Full active and passive range of motion of the bilateral hips, knees, and ankles.  DP and PT pulses are 2+ and symmetric.  5 out of 5 strength against resistance with dorsiflexion and plantarflexion.  Able to bear weight on the bilateral lower extremities.  Symmetric tandem gait.  Negative straight leg raise bilaterally.  Neurological: She is alert.  Skin: Skin is warm. No rash noted.  Psychiatric: Her behavior is normal.  Nursing note and vitals reviewed.    ED Treatments / Results  Labs (all labs ordered are listed, but only abnormal results are displayed) Labs Reviewed - No data to display  EKG None  Radiology No results found.  Procedures Procedures (including critical care time)  Medications Ordered in ED Medications  cyclobenzaprine (FLEXERIL) tablet 10 mg (10 mg Oral Given 12/30/17 2331)    ketorolac (TORADOL) injection 60 mg (60 mg Intramuscular Given 12/30/17 2331)     Initial Impression / Assessment and Plan / ED Course  I have reviewed the triage vital signs and the nursing notes.  Pertinent labs & imaging results that were available during my care of the patient were reviewed by me and considered in my medical decision making (see chart for details).     47 year old female with a history of hypertension presenting with back and leg pain.  She was seen for the same in the ED on Dec 19, 2017.  She is not currently employed.  It sounds as if she spends most of her days sitting.  On her exam, she has diffuse reproducible tenderness to the musculature of the lumbar spine and hips.  Her symptoms have been gradually improving with the symptomatic regimen that she was given at her last visit, but her pain has not completely resolved, which is why she has  returned.  I suspect that the flexor muscles of the hips are very tight due to her daily activities.  I have encouraged her to stretch regularly and to follow-up with her primary care provider.  She is having difficulty sleeping so I will change her Robaxin to Flexeril.  Doubt septic joint, lumbar radiculopathy, sciatica, cauda equina, AAA, infection, or fracture. No h/o cancer, IVDU.  Strict return precautions given.  The patient is hemodynamically stable and in no acute distress.  She is safe for discharge home at this time.   Final Clinical Impressions(s) / ED Diagnoses   Final diagnoses:  Hip pain, bilateral  Acute myofascial strain of lumbar region, subsequent encounter    ED Discharge Orders        Ordered    ibuprofen (ADVIL,MOTRIN) 800 MG tablet  3 times daily     12/30/17 2324    cyclobenzaprine (FLEXERIL) 10 MG tablet  2 times daily PRN     12/30/17 2324       McDonald, Mia A, PA-C 12/30/17 2336    Macarthur Critchley, MD 12/31/17 0018

## 2017-12-30 NOTE — ED Triage Notes (Signed)
Pt reports having sciatica with severe lower back pain that radiates down bilateral legs. Ambulatory at triage. Reports no relief with pain meds prescribed.

## 2017-12-30 NOTE — ED Notes (Signed)
Pt verbalizes understanding of d/c instructions. Pt received prescriptions. Pt ambulatory at d/c with all belongings.  

## 2017-12-30 NOTE — ED Notes (Signed)
See EDP assessment 

## 2017-12-30 NOTE — Discharge Instructions (Signed)
Thank you for allowing me to provide your care today in the emergency department.  Please keep your follow-up appointment with your primary care provider in July.  On exam, the muscles in your hips seem very tight and tense.  This is likely the cause of your pain.  Stretching is one of the easiest ways to improve your pain without taking additional medications.  Try to stretch for 30 minutes every day.  I have attached some stretches that will improve the flexibility of the muscles in your hips.  Take 1 tablet of ibuprofen with food every 8 hours for pain.  Please do not take any other medications such as naproxen, Naprosyn, Aleve, Motrin, or NSAIDs while taking this medication.  You also take 1 tablet of Flexeril every 12 hours as needed for muscle pain and spasms.  This is a muscle relaxer and it can make you drowsy.  Please do not work or drive while taking this medication.  If your symptoms do not improve, sometimes there are medications such as gabapentin that your primary care provider may recommend.  Return to the emergency department if you have a fall or injury, if you pee or poop on yourself, have new numbness or weakness, or a high fever with your pain.

## 2018-02-15 ENCOUNTER — Ambulatory Visit: Payer: Self-pay | Admitting: Internal Medicine

## 2018-02-23 ENCOUNTER — Ambulatory Visit: Payer: Self-pay | Admitting: Internal Medicine

## 2018-02-25 ENCOUNTER — Ambulatory Visit: Payer: Self-pay | Admitting: Internal Medicine

## 2018-03-03 ENCOUNTER — Ambulatory Visit: Payer: Self-pay | Admitting: Internal Medicine

## 2018-03-03 ENCOUNTER — Encounter: Payer: Self-pay | Admitting: Internal Medicine

## 2018-03-03 VITALS — BP 130/82 | HR 90 | Resp 12 | Ht 64.0 in | Wt 182.0 lb

## 2018-03-03 DIAGNOSIS — B86 Scabies: Secondary | ICD-10-CM

## 2018-03-03 DIAGNOSIS — M5431 Sciatica, right side: Secondary | ICD-10-CM

## 2018-03-03 MED ORDER — MELOXICAM 15 MG PO TABS: 15.0000 mg | ORAL_TABLET | Freq: Every day | ORAL | 0 refills | Status: DC

## 2018-03-03 MED ORDER — PERMETHRIN 5 % EX CREA: TOPICAL_CREAM | CUTANEOUS | 0 refills | Status: DC

## 2018-04-14 ENCOUNTER — Ambulatory Visit: Payer: Self-pay | Admitting: Internal Medicine

## 2018-04-14 ENCOUNTER — Encounter: Payer: Self-pay | Admitting: Internal Medicine

## 2018-04-14 VITALS — BP 118/84 | HR 78 | Resp 12 | Ht 64.0 in | Wt 177.0 lb

## 2018-04-14 DIAGNOSIS — K029 Dental caries, unspecified: Secondary | ICD-10-CM

## 2018-04-14 DIAGNOSIS — M5431 Sciatica, right side: Secondary | ICD-10-CM

## 2018-04-14 DIAGNOSIS — E041 Nontoxic single thyroid nodule: Secondary | ICD-10-CM

## 2018-04-14 DIAGNOSIS — M7061 Trochanteric bursitis, right hip: Secondary | ICD-10-CM

## 2018-04-14 DIAGNOSIS — M7071 Other bursitis of hip, right hip: Secondary | ICD-10-CM

## 2018-04-14 DIAGNOSIS — I10 Essential (primary) hypertension: Secondary | ICD-10-CM

## 2018-04-14 MED ORDER — IBUPROFEN 200 MG PO TABS: ORAL_TABLET | ORAL | 0 refills | Status: DC

## 2018-04-14 MED ORDER — CYCLOBENZAPRINE HCL 10 MG PO TABS: 10.0000 mg | ORAL_TABLET | Freq: Two times a day (BID) | ORAL | 0 refills | Status: DC | PRN

## 2018-04-14 NOTE — Progress Notes (Signed)
Subjective:    Patient ID: Diana Bean, female    DOB: 05/30/1971, 47 y.o.   MRN: [id number]  HPI   1.  Right sided hip and foot pain:  Was seen 03/03/2018 by Dr. Carolann Littler.  Told to get a donut cushion, stop ibuprofen and start meloxicam 15 mg daily with meal.  To avoid further corticosteroids as had been prescribed steroid burst and taper in May 3 times. Was referred to PT, but did not go.  She states she was not notified of appt. She did get the donut cushion.  States the pain is not as bad as when first started, but still a daily problem.  Patient gives history of the pain starting in her anterior leg, radiating up into her groin.  No buttock or back pain at that point.  No history of injury just prior.  Just awakened with the pain one day and gradually worsened over about a week.    Went to ED on 11/28/17 first time (left without being seen), again May 19 and again May 5/30.     I saw her for follow up on 5/14 as well, and she felt her pain was due to all the rain and this was a recurrence of pain from a fall on her knee in 2017.  She was taking ibuprofen.  On May 19th in ED, complained of her right low back and buttock hurting as well as thigh.  Was diagnosed with sciatica and treated with Robaxin, medrol dosepak and Naproxen.  Felt better for a while, but then pain recurred.  Returned to ED on 5/30 and complained of bilateral hip pain radiating into bilateral knees.  She gave a history of sitting much of day and diagnosed with acute myofascial strain of lumbar region and bilateral hip pain.  Treated with ibuprofen and cyclobenzaprine  I do not see evidence of corticosteroid treatment more than the one time.  She is only using Advil without much improvement  400 mg at a time.  Taking 9 ASA daily as well.  2.  Dental pain:  Needs dental referral.  Has just a lot of decay and areas of pain.  Many broken teeth  3.  Right Thyroid nodule:  Did not have an orange card when found on  exam in May.  Just obtained.  Will send for thyroid ultrasound.  TSH was in normal range in May.  4.  Hypertension:  Controlled on HCTZ.  Current Meds  Medication Sig  . hydrochlorothiazide (HYDRODIURIL) 12.5 MG tablet Take 1 tablet (12.5 mg total) by mouth daily.  Marland Kitchen ibuprofen (ADVIL,MOTRIN) 800 MG tablet Take 1 tablet (800 mg total) by mouth 3 (three) times daily.  . meloxicam (MOBIC) 15 MG tablet Take 1 tablet (15 mg total) by mouth daily.    No Known Allergies  Review of Systems     Objective:   Physical Exam NAD HEENT:  Significant dental decay and dental loss.  Many broken teeth at gumline. Neck: Supple, No adenopathy,  + thyroid nodules, both left and right. Chest:  CTA CV: RRR without murmur or rub.  Radial and DP pulses normal and equal MS:  NT over spinous processes.  Mild lumbosacral paraspinous muscular tenderness.   Tender over right ischial tuberosity, right greater trochanter, both of which cause radiation of pain down anterolateral thigh to knee. Neuro, LE:  Motor 5/5, DTRs 2+/4 bilaterally.  Normal Gait.       Assessment & Plan:  1.  ?right sided  sciatica, right ischial bursitis, right greater trochanteric bursitis:  Cyclobenazaprine 10 mg twice daily as needed. Ibuprofen 600 mg twice daily with food. Stop ASA Gave stretches to perform twice daily PT referral to [facility].  2.  Thyroid nodule:  [facility] referral for [place].  TSH normal in May.  3.  Hypertension: controlled  4.  Dental Decay:  Dental referral as now with orange card.

## 2018-04-26 ENCOUNTER — Ambulatory Visit: Payer: Self-pay | Admitting: Internal Medicine

## 2018-04-26 ENCOUNTER — Encounter: Payer: Self-pay | Admitting: Internal Medicine

## 2018-04-26 VITALS — BP 138/80 | HR 82 | Resp 12 | Ht 64.0 in | Wt 178.0 lb

## 2018-04-26 DIAGNOSIS — B029 Zoster without complications: Secondary | ICD-10-CM | POA: Insufficient documentation

## 2018-04-26 MED ORDER — IBUPROFEN 200 MG PO TABS: ORAL_TABLET | ORAL | 0 refills | Status: DC

## 2018-04-26 MED ORDER — CAPSAICIN 0.025 % EX CREA: TOPICAL_CREAM | Freq: Two times a day (BID) | CUTANEOUS | 0 refills | Status: DC

## 2018-04-26 MED ORDER — ACYCLOVIR 800 MG PO TABS: ORAL_TABLET | ORAL | 0 refills | Status: DC

## 2018-04-26 NOTE — Progress Notes (Signed)
   Subjective:    Patient ID: Diana Bean, female    DOB: 10-16-1970, 47 y.o.   MRN: [id number]  HPI  Started 2 days ago with pain and itching with patches of vesicles at right posterior shoulder and chest as well as inner upper arm on right.   She does not recall having chicken pox when young. No fever. Calamine lotion helps   Review of Systems     Objective:   Physical Exam NAD Patches of clear vesicles on erythematous base high on posterior right shoulder, inner upper right arm just before entering axilla, and right anterior chest.  Most consistent with T2 dermatome. Rest of exam normal       Assessment & Plan:  Right thoracic shingles/herpes zoster:  Acyclovir 800 mg 5 times daily for 7 days. Ibuprofen as needed for pain. May try Capsaicin cream topically for pain as well.

## 2018-05-25 IMAGING — CR DG KNEE COMPLETE 4+V*R*
4 series · 4 of 4 positions shown · non-contrast
Comparison: 02/15/2010

CLINICAL DATA: Right anterior knee pain with laceration after
falling on steps at home 3 days ago.

EXAM:
RIGHT KNEE - COMPLETE 4+ VIEW

[knee ap]
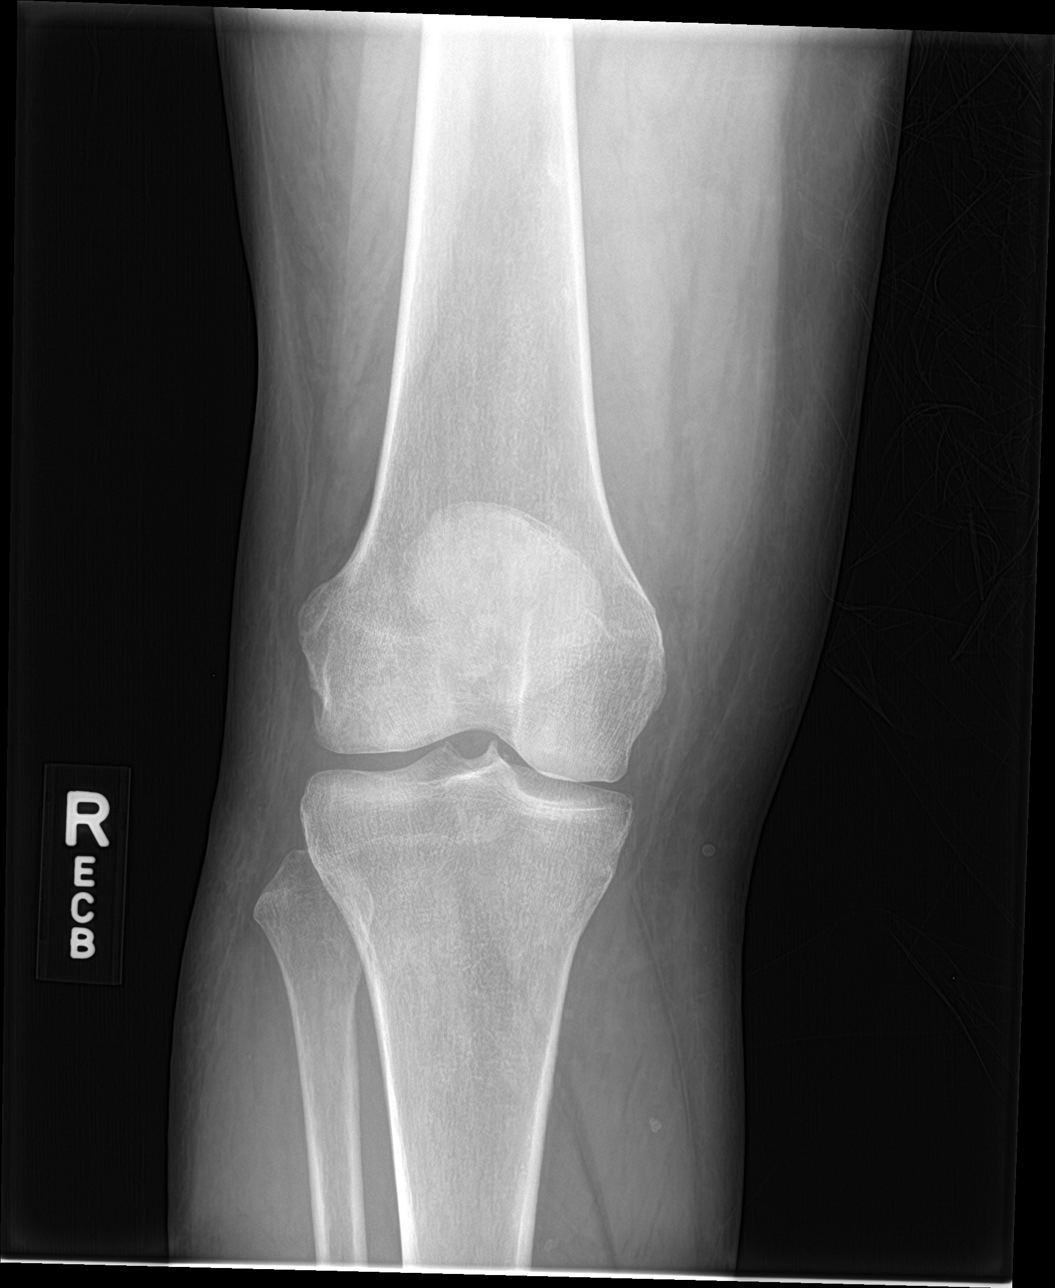

[knee lat]
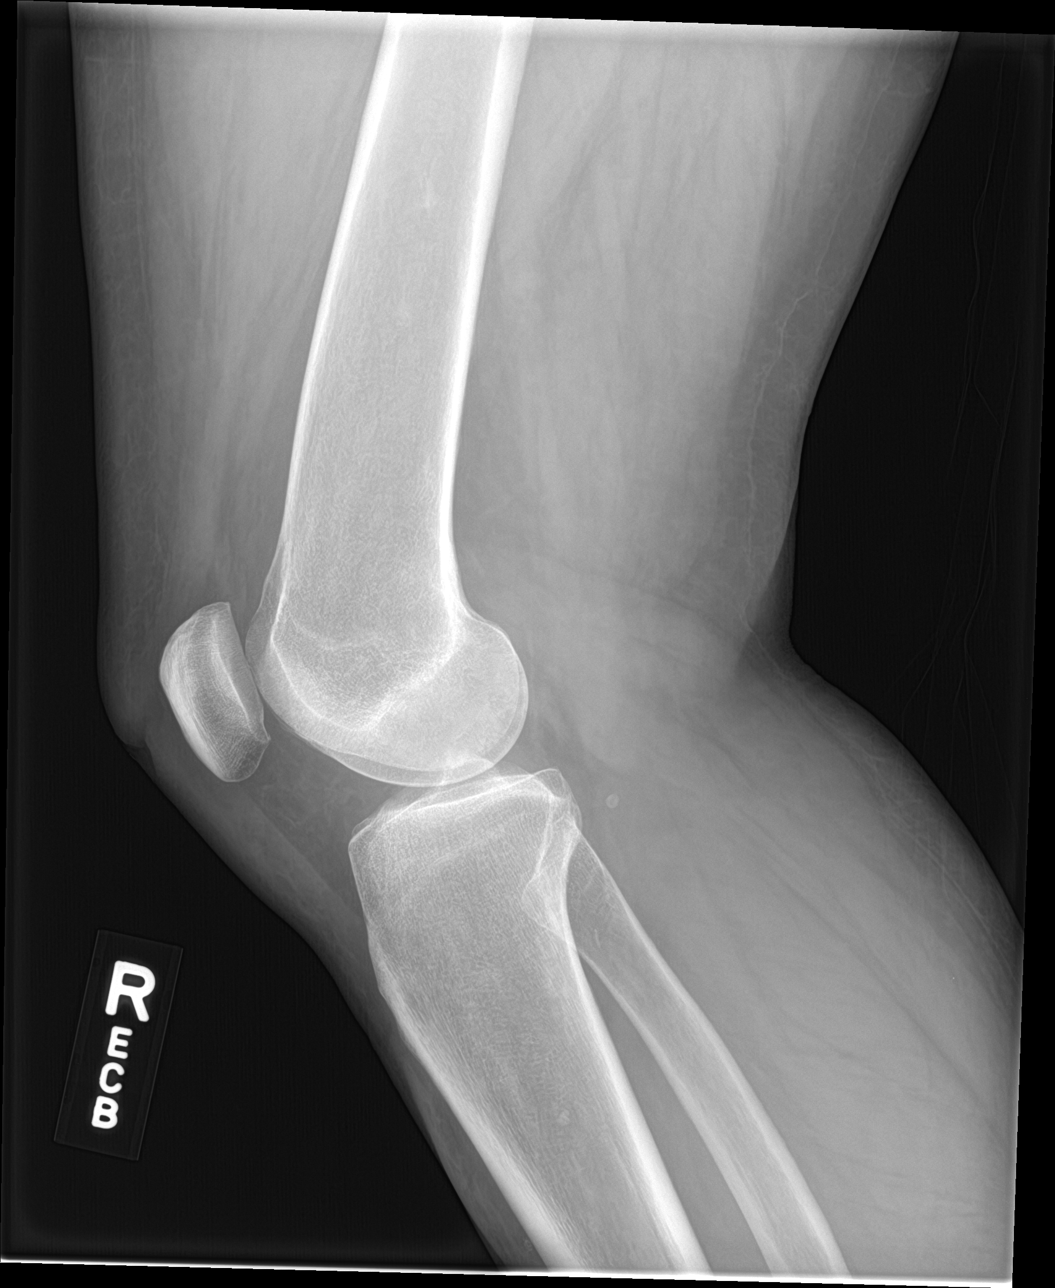

[knee obl (1 of 2)]
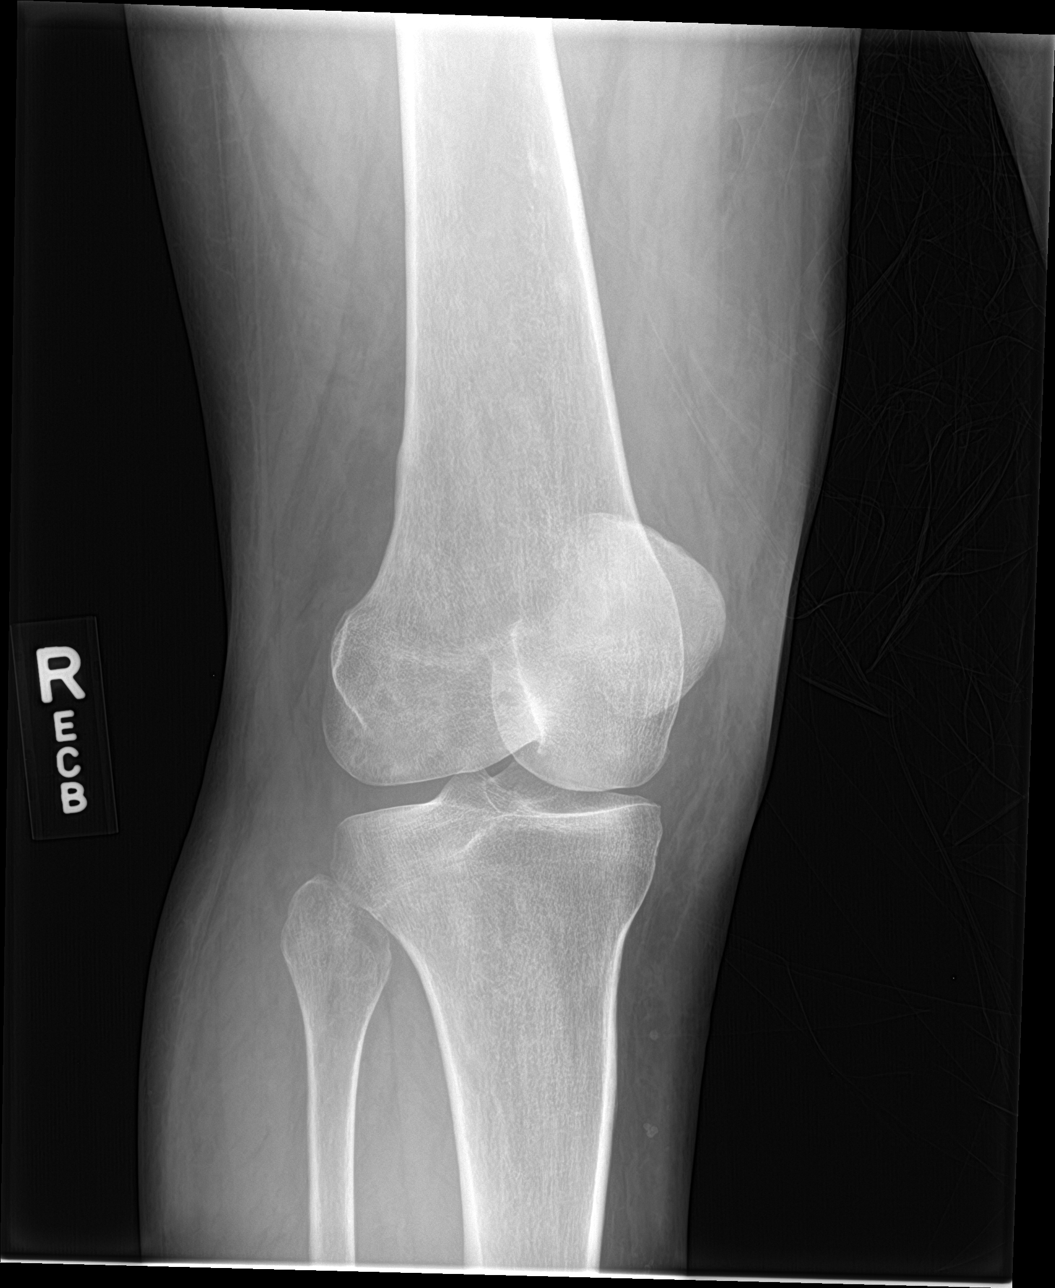

[knee obl (2 of 2)]
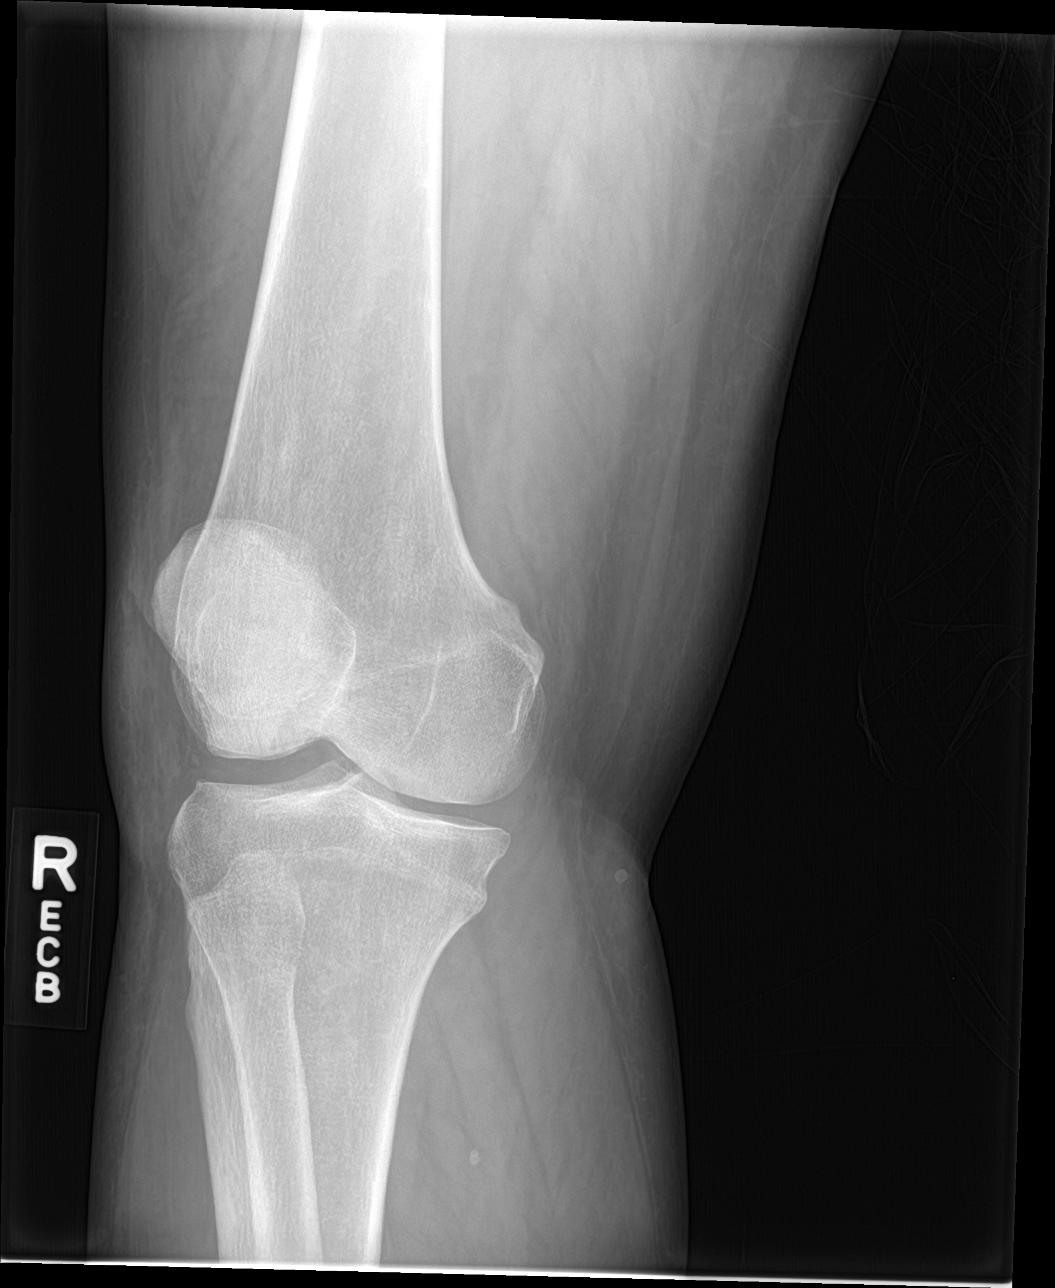

[4 of 4 positions shown; findings below may reference images not displayed]

FINDINGS: Skin defect over the patella consistent with history of laceration.
No radiopaque soft tissue foreign bodies. No evidence of acute
fracture or dislocation of the right knee. No focal bone lesion or
bone destruction. No significant effusion. Soft tissue
calcifications are likely phleboliths.
IMPRESSION: Soft tissue laceration anterior to the patella. No acute bony
abnormalities.

## 2018-06-02 NOTE — Progress Notes (Signed)
   Subjective:    Patient ID: Diana Bean, female    DOB: [birth year], 47 y.o.   MRN: [id number]  HPI   This is transcribed from written note by covering physician, Diana Bean.  BP:  Taking HCTZ daily, feeling fine.  BP's at drugstore--130/80  Since April, Right sided hip--foot pain; feels numbness R lateral buttock, but no weakness.  Went to ER 2 X in May--Muscle relaxers doesn't work.  Medrol dose pack with refills--taken 3 times, most recently in June--helped but pain back.  Now taking ibuprofen, helps but pain recurs.  Hurts at night.    Review of Systems     Objective:   Physical Exam        Assessment & Plan:  Donut cushion to sit on;  Avoid pressure under buttock when sitting. Stop ibuprofen + aleve.  Start Meloxicam 15 mg one daily with a meal  #30/NRF ([facility]) Avoid further steroids at this time.  Refer for Physical Therapy.  Pyrmethrine cream 5% apply at bedtime neck down at bedtime.  Wash off in shower at least 8 hrs later and change all sheets and clothes.  F/u with Diana Bean in 6 wks for sciatica

## 2018-07-15 ENCOUNTER — Ambulatory Visit: Payer: Self-pay | Admitting: Internal Medicine

## 2018-08-24 ENCOUNTER — Ambulatory Visit: Payer: Self-pay | Admitting: Internal Medicine

## 2018-10-08 ENCOUNTER — Emergency Department (HOSPITAL_COMMUNITY): Admission: EM | Admit: 2018-10-08 | Discharge: 2018-10-08 | Discharge status: Absent without leave | Payer: Self-pay

## 2018-10-08 NOTE — ED Notes (Signed)
Observed pt walking out of the ED.

## 2019-05-31 ENCOUNTER — Other Ambulatory Visit: Payer: Self-pay

## 2019-05-31 ENCOUNTER — Encounter: Payer: Self-pay | Admitting: Internal Medicine

## 2019-05-31 ENCOUNTER — Ambulatory Visit (INDEPENDENT_AMBULATORY_CARE_PROVIDER_SITE_OTHER): Payer: Self-pay | Admitting: Internal Medicine

## 2019-05-31 VITALS — BP 122/82 | HR 78 | Resp 12 | Ht 64.0 in | Wt 166.0 lb

## 2019-05-31 DIAGNOSIS — L299 Pruritus, unspecified: Secondary | ICD-10-CM

## 2019-05-31 DIAGNOSIS — D1722 Benign lipomatous neoplasm of skin and subcutaneous tissue of left arm: Secondary | ICD-10-CM

## 2019-05-31 DIAGNOSIS — Z23 Encounter for immunization: Secondary | ICD-10-CM

## 2019-05-31 DIAGNOSIS — M67432 Ganglion, left wrist: Secondary | ICD-10-CM

## 2019-05-31 NOTE — Patient Instructions (Signed)
Nizoral Shampoo 1%--can find at any pharmacy in dandruff shampoo section.  Soap up hair and scalp and leave on for 10 minutes before rinsing at end of shower twice weekly.  You might try Eucerin Cream for eczema relief after shower daily.

## 2019-05-31 NOTE — Progress Notes (Signed)
    Subjective:    Patient ID: Diana Bean, female   DOB: 10/25/1970, 48 y.o.   MRN: [id number]   HPI   2 bumps on wrist/forearm--volar aspect for past 2 months.  She can push the one on her wrist in.  May be a bit bigger with time.  Skin itching and sense of bugs biting her on legs and around hair line.  Very concerned she lost her job because she was passing off scabies or head lice or some sort of bug to others in office. Her boyfriend, with whom she sleeps daily does not have any skin/scalp complaints. She has not noted any rash or bites with her itching.  She has her orange card for only a short time and needs to reup.  She has not followed up with orange card so we an get thyroid ultrasound    No outpatient medications have been marked as taking for the 05/31/19 encounter (Office Visit) with Mack Hook, MD.   No Known Allergies   Review of Systems    Objective:   BP 122/82 (BP Location: Left Arm, Patient Position: Sitting, Cuff Size: Normal)   Pulse 78   Resp 12   Ht 5\' 4"  (1.626 m)   Wt 166 lb (75.3 kg)   LMP  (LMP Unknown)   BMI 28.49 kg/m   Physical Exam  Skin: no lesions/rashes.  Skin around hairline and scalp appears normal.  Somewhat excess hair grease/oil with build up in certain areas where it is thick and yellow on top of hair. Arms and back also without lesion.  Legs with small dots of hypomelanosis and perhaps a bit dry, but no rash or lesions noted otherwise. Left volar forearm and wrist:  2 cm fatty soft tissue mass that moves within thickness of skin.  1/2 cm of compressible cystic mass mid volar wrist.  Both are nontender on palpation and had full ROM of wrist without pain.   Assessment & Plan  1.  Ganglion cyst, left volar wrist:  Follow for now.  Essentially asymptomatic.  To let me know if develops pain or other problem--gets large and in the way  2.  Lipoma of left forearm:  Follow also.  To call if concerning changes.  3.  Itching:  No  findings.  She insisted on some sort of shampoo.  Encouraged her to consider perhaps changing her hair product, but suspect this is more her anxiety of having something contagious. As her boyfriend has nothing, and I can find nothing, this is more likely the case. Nizoral shampoo 1% twice weekly to hair and scalp for 10 minutes, then rinse thoroughly.  4.  HM:  Influenza vaccine

## 2019-10-02 ENCOUNTER — Encounter: Payer: Self-pay | Admitting: Internal Medicine

## 2020-01-03 ENCOUNTER — Encounter (HOSPITAL_COMMUNITY): Payer: Self-pay | Admitting: Emergency Medicine

## 2020-01-03 ENCOUNTER — Emergency Department (HOSPITAL_COMMUNITY)
Admission: EM | Admit: 2020-01-03 | Discharge: 2020-01-03 | Disposition: A | Discharge status: Home / Self Care | Payer: Self-pay | Attending: Emergency Medicine | Admitting: Emergency Medicine

## 2020-01-03 DIAGNOSIS — F10229 Alcohol dependence with intoxication, unspecified: Secondary | ICD-10-CM | POA: Insufficient documentation

## 2020-01-03 DIAGNOSIS — I10 Essential (primary) hypertension: Secondary | ICD-10-CM | POA: Insufficient documentation

## 2020-01-03 DIAGNOSIS — F17210 Nicotine dependence, cigarettes, uncomplicated: Secondary | ICD-10-CM | POA: Insufficient documentation

## 2020-01-03 DIAGNOSIS — Y999 Unspecified external cause status: Secondary | ICD-10-CM | POA: Insufficient documentation

## 2020-01-03 DIAGNOSIS — Y939 Activity, unspecified: Secondary | ICD-10-CM | POA: Insufficient documentation

## 2020-01-03 DIAGNOSIS — S0181XA Laceration without foreign body of other part of head, initial encounter: Secondary | ICD-10-CM | POA: Insufficient documentation

## 2020-01-03 DIAGNOSIS — X58XXXA Exposure to other specified factors, initial encounter: Secondary | ICD-10-CM | POA: Insufficient documentation

## 2020-01-03 DIAGNOSIS — Y929 Unspecified place or not applicable: Secondary | ICD-10-CM | POA: Insufficient documentation

## 2020-01-03 MED ORDER — LIDOCAINE-EPINEPHRINE (PF) 2 %-1:200000 IJ SOLN
20.0000 mL | Freq: Once | INTRAMUSCULAR | Status: AC
  Administered 2020-01-03: 20 mL via INTRADERMAL
  Filled 2020-01-03: qty 20
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
## 2020-01-03 NOTE — Discharge Instructions (Signed)
Keep area clean by washing with soap and water daily. Do not scrub stitches Apply a bandage at least once daily, change more often if it is dirty Apply antibiotic ointment to the wound to help with healing and to prevent infection Watch for signs of infection (redness, drainage, worsening pain) Take Tylenol or Ibuprofen for pain as needed Have stitches removed in 5-7 days
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
## 2020-01-18 ENCOUNTER — Encounter: Payer: Self-pay | Admitting: Internal Medicine

## 2020-01-18 ENCOUNTER — Ambulatory Visit (INDEPENDENT_AMBULATORY_CARE_PROVIDER_SITE_OTHER): Payer: Self-pay | Admitting: Internal Medicine

## 2020-01-18 VITALS — BP 136/88 | HR 78 | Resp 12 | Ht 64.0 in | Wt 183.0 lb

## 2020-01-18 DIAGNOSIS — S0181XD Laceration without foreign body of other part of head, subsequent encounter: Secondary | ICD-10-CM

## 2020-01-18 DIAGNOSIS — Z4802 Encounter for removal of sutures: Secondary | ICD-10-CM

## 2020-01-18 NOTE — Progress Notes (Signed)
    Subjective:    Patient ID: Diana Bean, female   DOB: Apr 09, 1971, 49 y.o.   MRN: [id number]   HPI  Patient with long right facial wound for which she was evaluated and treated in ED on 01/03/2020.  She is not certain how this occurred.  Was drinking at the time and apparently clinically intoxicated when in ED. Late on getting sutures removed. Here for suture removal. No problems with wound.    Current Meds  Medication Sig  . ibuprofen (ADVIL) 200 MG tablet Take 200 mg by mouth every 6 (six) hours as needed.    No Known Allergies   Review of Systems    Objective:  Blood pressure 136/88, pulse 78, resp. rate 12, height 5\' 4"  (1.626 m), weight 183 lb (83 kg).  Physical Exam  Right facial wound with edges well opposed.  No erythema or swelling. 25 simple sutures removed  Tolerated well.   Assessment & Plan   1.  Right facial laceration and repair, healing well.  Sutures removed today.  To call for signs of infection.  2.  Alcoholism:  Encouraged working with our [place], [employer].  She will think about it.

## 2021-04-15 ENCOUNTER — Other Ambulatory Visit: Payer: Self-pay

## 2021-04-15 ENCOUNTER — Encounter (HOSPITAL_COMMUNITY): Payer: Self-pay | Admitting: Emergency Medicine

## 2021-04-15 ENCOUNTER — Emergency Department (HOSPITAL_COMMUNITY)
Admission: EM | Admit: 2021-04-15 | Discharge: 2021-04-16 | Disposition: A | Discharge status: Home / Self Care | Payer: Self-pay | Attending: Emergency Medicine | Admitting: Emergency Medicine

## 2021-04-15 DIAGNOSIS — F17210 Nicotine dependence, cigarettes, uncomplicated: Secondary | ICD-10-CM | POA: Insufficient documentation

## 2021-04-15 DIAGNOSIS — Z20822 Contact with and (suspected) exposure to covid-19: Secondary | ICD-10-CM | POA: Insufficient documentation

## 2021-04-15 DIAGNOSIS — I10 Essential (primary) hypertension: Secondary | ICD-10-CM | POA: Insufficient documentation

## 2021-04-15 DIAGNOSIS — B349 Viral infection, unspecified: Secondary | ICD-10-CM

## 2021-04-15 DIAGNOSIS — B343 Parvovirus infection, unspecified: Secondary | ICD-10-CM | POA: Insufficient documentation

## 2021-04-15 DIAGNOSIS — Z79899 Other long term (current) drug therapy: Secondary | ICD-10-CM | POA: Insufficient documentation

## 2021-04-15 LAB — COMPREHENSIVE METABOLIC PANEL
ALT: 19 U/L (ref 0–44)
AST: 21 U/L (ref 15–41)
Albumin: 3.7 g/dL (ref 3.5–5.0)
Alkaline Phosphatase: 52 U/L (ref 38–126)
Anion gap: 8 (ref 5–15)
BUN: 18 mg/dL (ref 6–20)
CO2: 21 mmol/L — ABNORMAL LOW (ref 22–32)
Calcium: 9.4 mg/dL (ref 8.9–10.3)
Chloride: 109 mmol/L (ref 98–111)
Creatinine, Ser: 0.8 mg/dL (ref 0.44–1.00)
GFR, Estimated: >60 mL/min (ref >60)
Glucose, Bld: 77 mg/dL (ref 70–99)
Potassium: 3.8 mmol/L (ref 3.5–5.1)
Sodium: 138 mmol/L (ref 135–145)
Total Bilirubin: 0.6 mg/dL (ref 0.3–1.2)
Total Protein: 7.3 g/dL (ref 6.5–8.1)

## 2021-04-15 LAB — URINALYSIS, ROUTINE W REFLEX MICROSCOPIC
Bilirubin Urine: NEGATIVE
Glucose, UA: NEGATIVE mg/dL
Hgb urine dipstick: NEGATIVE
Ketones, ur: 20 mg/dL — AB
Nitrite: NEGATIVE
Protein, ur: NEGATIVE mg/dL
Specific Gravity, Urine: 1.021 (ref 1.005–1.030)
pH: 5 (ref 5.0–8.0)

## 2021-04-15 LAB — CBC WITH DIFFERENTIAL/PLATELET
Abs Immature Granulocytes: 0.02 10*3/uL (ref 0.00–0.07)
Basophils Absolute: 0 10*3/uL (ref 0.0–0.1)
Basophils Relative: 1 %
Eosinophils Absolute: 0.1 10*3/uL (ref 0.0–0.5)
Eosinophils Relative: 1 %
HCT: 40.3 % (ref 36.0–46.0)
Hemoglobin: 12.8 g/dL (ref 12.0–15.0)
Immature Granulocytes: 0 %
Lymphocytes Relative: 43 %
Lymphs Abs: 2.6 10*3/uL (ref 0.7–4.0)
MCH: 30.5 pg (ref 26.0–34.0)
MCHC: 31.8 g/dL (ref 30.0–36.0)
MCV: 96 fL (ref 80.0–100.0)
Monocytes Absolute: 0.5 10*3/uL (ref 0.1–1.0)
Monocytes Relative: 8 %
Neutro Abs: 2.9 10*3/uL (ref 1.7–7.7)
Neutrophils Relative %: 47 %
Platelets: 303 10*3/uL (ref 150–400)
RBC: 4.2 MIL/uL (ref 3.87–5.11)
RDW: 14.9 % (ref 11.5–15.5)
WBC: 6.2 10*3/uL (ref 4.0–10.5)
nRBC: 0 % (ref 0.0–0.2)

## 2021-04-15 LAB — LIPASE, BLOOD: Lipase: 31 U/L (ref 11–51)

## 2021-04-15 LAB — I-STAT BETA HCG BLOOD, ED (MC, WL, AP ONLY): I-stat hCG, quantitative: <5 m[IU]/mL (ref <5)

## 2021-04-15 MED ORDER — SODIUM CHLORIDE 0.9 % IV BOLUS
1000.0000 mL | Freq: Once | INTRAVENOUS | Status: AC
  Administered 2021-04-16: 1000 mL via INTRAVENOUS

## 2021-04-15 MED ORDER — DIPHENHYDRAMINE HCL 50 MG/ML IJ SOLN
25.0000 mg | Freq: Once | INTRAMUSCULAR | Status: AC
  Administered 2021-04-16: 25 mg via INTRAVENOUS
  Filled 2021-04-15: qty 1

## 2021-04-15 MED ORDER — KETOROLAC TROMETHAMINE 30 MG/ML IJ SOLN
15.0000 mg | Freq: Once | INTRAMUSCULAR | Status: AC
  Administered 2021-04-16: 15 mg via INTRAVENOUS
  Filled 2021-04-15: qty 1

## 2021-04-15 MED ORDER — METOCLOPRAMIDE HCL 5 MG/ML IJ SOLN
10.0000 mg | Freq: Once | INTRAMUSCULAR | Status: AC
  Administered 2021-04-16: 10 mg via INTRAVENOUS
  Filled 2021-04-15: qty 2

## 2021-04-15 NOTE — ED Notes (Signed)
Patient stepping to their car for charger

## 2021-04-15 NOTE — ED Triage Notes (Signed)
Pt states "I believe I have a tape worm." Pt has been having headaches, abd pain and diarrhea for 3 days. Pt states her stool has mucous in it.

## 2021-04-15 NOTE — ED Provider Notes (Signed)
[facility] EMERGENCY DEPARTMENT Provider Note   CSN: [id number] Arrival date & time: 04/15/21  1345     History Chief Complaint  Patient presents with   Abdominal Pain    Diana Bean is a 50 y.o. female.  Patient presents to the emergency department with complaints of headache, dizziness, watery stools for several days.  Patient reports that when she wiped she saw a string, thinks it might have been a tapeworm.      Past Medical History:  Diagnosis Date   Hypertension 2008    Patient Active Problem List   Diagnosis Date Noted   Herpes zoster without complication 0000000   Dental decay 12/14/2017   Class 1 obesity with serious comorbidity and body mass index (BMI) of 31.0 to 31.9 in adult 12/14/2017   Hypertension 08/03/2006    Past Surgical History:  Procedure Laterality Date   TUBAL LIGATION       OB History   No obstetric history on file.     Family History  Problem Relation Age of Onset   Multiple sclerosis Daughter     Social History   Tobacco Use   Smoking status: Some Days    Years: 29.00    Types: Cigarettes   Smokeless tobacco: Never   Tobacco comments:    smokes maybe 10 cigs per month.  Vaping Use   Vaping Use: Never used  Substance Use Topics   Alcohol use: Yes    Comment: 40 oz weekly or a 6 pack of beer once weekly   Drug use: Yes    Frequency: 7.0 times per week    Types: Marijuana    Home Medications Prior to Admission medications   Medication Sig Start Date End Date Taking? Authorizing Provider  loperamide (IMODIUM) 2 MG capsule Take 1 capsule (2 mg total) by mouth 4 (four) times daily as needed for diarrhea or loose stools. 04/16/21  Yes Jesus Nevills, Gwenyth Allegra, MD  ondansetron (ZOFRAN) 4 MG tablet Take 1 tablet (4 mg total) by mouth every 6 (six) hours. 04/16/21  Yes Nasiyah Laverdiere, Gwenyth Allegra, MD  hydrochlorothiazide (HYDRODIURIL) 12.5 MG tablet Take 1 tablet (12.5 mg total) by mouth daily. Patient not  taking: Reported on 01/18/2020 12/14/17   Mack Hook, MD  ibuprofen (ADVIL) 200 MG tablet Take 200 mg by mouth every 6 (six) hours as needed.    [provider]    Allergies    Patient has no known allergies.  Review of Systems   Review of Systems  Gastrointestinal:  Positive for diarrhea.  Neurological:  Positive for dizziness and headaches.  All other systems reviewed and are negative.  Physical Exam Updated Vital Signs BP (!) 131/103   Pulse 78   Temp 98.8 F (37.1 C) (Oral)   Resp 17   LMP  (LMP Unknown)   SpO2 98%   Physical Exam Vitals and nursing note reviewed.  Constitutional:      General: She is not in acute distress.    Appearance: Normal appearance. She is well-developed.  HENT:     Head: Normocephalic and atraumatic.     Right Ear: Hearing normal.     Left Ear: Hearing normal.     Nose: Nose normal.  Eyes:     Conjunctiva/sclera: Conjunctivae normal.     Pupils: Pupils are equal, round, and reactive to light.  Cardiovascular:     Rate and Rhythm: Regular rhythm.     Heart sounds: S1 normal and S2 normal. No murmur  heard.   No friction rub. No gallop.  Pulmonary:     Effort: Pulmonary effort is normal. No respiratory distress.     Breath sounds: Normal breath sounds.  Chest:     Chest wall: No tenderness.  Abdominal:     General: Bowel sounds are normal.     Palpations: Abdomen is soft.     Tenderness: There is no abdominal tenderness. There is no guarding or rebound. Negative signs include Murphy's sign and McBurney's sign.     Hernia: No hernia is present.  Musculoskeletal:        General: Normal range of motion.     Cervical back: Normal range of motion and neck supple.  Skin:    General: Skin is warm and dry.     Findings: No rash.  Neurological:     Mental Status: She is alert and oriented to person, place, and time.     GCS: GCS eye subscore is 4. GCS verbal subscore is 5. GCS motor subscore is 6.     Cranial Nerves: No  cranial nerve deficit.     Sensory: No sensory deficit.     Coordination: Coordination normal.  Psychiatric:        Speech: Speech normal.        Behavior: Behavior normal.        Thought Content: Thought content normal.    ED Results / Procedures / Treatments   Labs (all labs ordered are listed, but only abnormal results are displayed) Labs Reviewed  COMPREHENSIVE METABOLIC PANEL - Abnormal; Notable for the following components:      Result Value   CO2 21 (*)    All other components within normal limits  URINALYSIS, ROUTINE W REFLEX MICROSCOPIC - Abnormal; Notable for the following components:   APPearance HAZY (*)    Ketones, ur 20 (*)    Leukocytes,Ua MODERATE (*)    Bacteria, UA RARE (*)    All other components within normal limits  RESP PANEL BY RT-PCR (FLU A&B, COVID) ARPGX2  CBC WITH DIFFERENTIAL/PLATELET  LIPASE, BLOOD  I-STAT BETA HCG BLOOD, ED ([facility], [facility], [facility] ONLY)    EKG None  Radiology No results found.  Procedures Procedures   Medications Ordered in ED Medications  sodium chloride 0.9 % bolus 1,000 mL (0 mLs Intravenous Stopped 04/16/21 0122)  ketorolac (TORADOL) 30 MG/ML injection 15 mg (15 mg Intravenous Given 04/16/21 0005)  metoCLOPramide (REGLAN) injection 10 mg (10 mg Intravenous Given 04/16/21 0004)  diphenhydrAMINE (BENADRYL) injection 25 mg (25 mg Intravenous Given 04/16/21 0004)    ED Course  I have reviewed the triage vital signs and the nursing notes.  Pertinent labs & imaging results that were available during my care of the patient were reviewed by me and considered in my medical decision making (see chart for details).    MDM Rules/Calculators/A&P                           Patient presents to the emergency department with multiple complaints.  Patient has not been feeling well for several days.  She reports that she has had a headache, queasy abdominal pain with diarrhea.  Upon examination she appears well.  Abdominal exam is benign.  She  does not have any tenderness currently.  No signs of peritonitis.  Lab work is all normal and reassuring.  Patient is afebrile with normal vital signs.  No concern for sepsis.  Headache has resolved with  Toradol and Reglan, IV fluids.  As she has had a normal neurologic exam throughout evaluation, does not require further work-up.  She feels well and will be discharged.  Final Clinical Impression(s) / ED Diagnoses Final diagnoses:  Acute viral syndrome    Rx / DC Orders ED Discharge Orders          Ordered    ondansetron (ZOFRAN) 4 MG tablet  Every 6 hours        04/16/21 0125    loperamide (IMODIUM) 2 MG capsule  4 times daily PRN        04/16/21 0125             Orpah Greek, MD 04/16/21 [id number]

## 2021-04-15 NOTE — ED Provider Notes (Signed)
Emergency Medicine Provider Triage Evaluation Note  Diana Bean , a 50 y.o. female  was evaluated in triage.  Pt complains of thinking she has a tapeworm. She states that she thinks she saw a long fb in her stool she is also c/o stomach pain and headaches.  Review of Systems  Positive: Headache, abd pain, diarrhea Negative: nv  Physical Exam  LMP  (LMP Unknown)  Gen:   Awake, no distress   Resp:  Normal effort  MSK:   Moves extremities without difficulty  Other:  Abd is soft and nontender  Medical Decision Making  Medically screening exam initiated at 2:10 PM.  Appropriate orders placed.  Diana Bean was informed that the remainder of the evaluation will be completed by another provider, this initial triage assessment does not replace that evaluation, and the importance of remaining in the ED until their evaluation is complete.     Bishop Dublin 04/15/21 1411    Hayden Rasmussen, MD 04/15/21 Lurena Nida

## 2021-04-16 LAB — RESP PANEL BY RT-PCR (FLU A&B, COVID) ARPGX2
Influenza A by PCR: NEGATIVE
Influenza B by PCR: NEGATIVE
SARS Coronavirus 2 by RT PCR: NEGATIVE

## 2021-04-16 MED ORDER — ONDANSETRON HCL 4 MG PO TABS
4.0000 mg | ORAL_TABLET | Freq: Four times a day (QID) | ORAL | 0 refills | Status: AC
Start: 2021-04-16 — End: ?

## 2021-04-16 MED ORDER — LOPERAMIDE HCL 2 MG PO CAPS: 2.0000 mg | ORAL_CAPSULE | Freq: Four times a day (QID) | ORAL | 0 refills | Status: AC | PRN

## 2021-04-16 NOTE — ED Notes (Signed)
I introduced myself to pt. Pt AxO x4 with GCS 15. Pt c/o frontal HA x3 days. Had some blurry vision with it yesterday, but denies other sx today. Was worried she had a tapeworm because her stool looked mucosy, but denies weight loss. She said she only noticed it once. HA 9/10. Denies further needs. Railings up and call light within reach.

## 2021-04-16 NOTE — ED Notes (Signed)
Pt ambulatory at d/c. Steady on feet. Went to restroom prior to going home. GCS 15.

## 2021-04-16 NOTE — ED Notes (Signed)
Pt's HA down to a 2/10

## 2021-09-11 ENCOUNTER — Emergency Department (HOSPITAL_COMMUNITY)
Admission: EM | Admit: 2021-09-11 | Discharge: 2021-09-11 | Disposition: A | Discharge status: Home / Self Care | Payer: Self-pay | Attending: Emergency Medicine | Admitting: Emergency Medicine

## 2021-09-11 ENCOUNTER — Emergency Department (HOSPITAL_COMMUNITY): Payer: Self-pay

## 2021-09-11 DIAGNOSIS — Z79899 Other long term (current) drug therapy: Secondary | ICD-10-CM | POA: Insufficient documentation

## 2021-09-11 DIAGNOSIS — R1011 Right upper quadrant pain: Secondary | ICD-10-CM | POA: Insufficient documentation

## 2021-09-11 LAB — COMPREHENSIVE METABOLIC PANEL
ALT: 15 U/L (ref 0–44)
AST: 19 U/L (ref 15–41)
Albumin: 3.4 g/dL — ABNORMAL LOW (ref 3.5–5.0)
Alkaline Phosphatase: 50 U/L (ref 38–126)
Anion gap: 8 (ref 5–15)
BUN: 18 mg/dL (ref 6–20)
CO2: 23 mmol/L (ref 22–32)
Calcium: 9 mg/dL (ref 8.9–10.3)
Chloride: 106 mmol/L (ref 98–111)
Creatinine, Ser: 0.75 mg/dL (ref 0.44–1.00)
GFR, Estimated: >60 mL/min (ref >60)
Glucose, Bld: 84 mg/dL (ref 70–99)
Potassium: 4.2 mmol/L (ref 3.5–5.1)
Sodium: 137 mmol/L (ref 135–145)
Total Bilirubin: 0.4 mg/dL (ref 0.3–1.2)
Total Protein: 7.2 g/dL (ref 6.5–8.1)

## 2021-09-11 LAB — URINALYSIS, COMPLETE (UACMP) WITH MICROSCOPIC
Bilirubin Urine: NEGATIVE
Glucose, UA: NEGATIVE mg/dL
Hgb urine dipstick: NEGATIVE
Ketones, ur: NEGATIVE mg/dL
Nitrite: NEGATIVE
Protein, ur: NEGATIVE mg/dL
Specific Gravity, Urine: 1.016 (ref 1.005–1.030)
pH: 5 (ref 5.0–8.0)

## 2021-09-11 LAB — CBC
HCT: 38.8 % (ref 36.0–46.0)
Hemoglobin: 12.6 g/dL (ref 12.0–15.0)
MCH: 31 pg (ref 26.0–34.0)
MCHC: 32.5 g/dL (ref 30.0–36.0)
MCV: 95.3 fL (ref 80.0–100.0)
Platelets: 321 10*3/uL (ref 150–400)
RBC: 4.07 MIL/uL (ref 3.87–5.11)
RDW: 14.6 % (ref 11.5–15.5)
WBC: 6.2 10*3/uL (ref 4.0–10.5)
nRBC: 0 % (ref 0.0–0.2)

## 2021-09-11 LAB — LIPASE, BLOOD: Lipase: 37 U/L (ref 11–51)

## 2021-09-11 MED ORDER — OMEPRAZOLE 20 MG PO CPDR: 20.0000 mg | DELAYED_RELEASE_CAPSULE | Freq: Every day | ORAL | 0 refills | Status: AC

## 2021-09-11 MED ORDER — IBUPROFEN 600 MG PO TABS: 600.0000 mg | ORAL_TABLET | Freq: Four times a day (QID) | ORAL | 0 refills | Status: AC | PRN

## 2021-09-11 NOTE — ED Provider Notes (Signed)
[facility] EMERGENCY DEPARTMENT Provider Note   CSN: [id number] Arrival date & time: 09/11/21  0449     History  Chief Complaint  Patient presents with   Abdominal Pain    Diana Bean is a 51 y.o. female who presents to the ED complaining of abdominal pain onset 4 days.  Patient that she still has her gallbladder and appendix.  Denies sick contacts.  Has tried over-the-counter Tylenol and ibuprofen with relief of her symptoms.  Denies fever, chills, nausea, vomiting, dysuria, hematuria, rectal bleeding, hemoptysis.  Denies excessive NSAID use, EtOH use, GERD.  The history is provided by the patient. No language interpreter was used.      Home Medications Prior to Admission medications   Medication Sig Start Date End Date Taking? Authorizing Provider  ibuprofen (ADVIL) 600 MG tablet Take 1 tablet (600 mg total) by mouth every 6 (six) hours as needed. 09/11/21  Yes Brion Hedges A, PA-C  omeprazole (PRILOSEC) 20 MG capsule Take 1 capsule (20 mg total) by mouth daily. 09/11/21 10/11/21 Yes Shakena Callari A, PA-C  hydrochlorothiazide (HYDRODIURIL) 12.5 MG tablet Take 1 tablet (12.5 mg total) by mouth daily. Patient not taking: Reported on 01/18/2020 12/14/17   Mack Hook, MD  loperamide (IMODIUM) 2 MG capsule Take 1 capsule (2 mg total) by mouth 4 (four) times daily as needed for diarrhea or loose stools. 04/16/21   Orpah Greek, MD  ondansetron (ZOFRAN) 4 MG tablet Take 1 tablet (4 mg total) by mouth every 6 (six) hours. 04/16/21   Orpah Greek, MD      Allergies    Patient has no known allergies.    Review of Systems   Review of Systems  Constitutional:  Negative for chills and fever.  Respiratory:  Negative for shortness of breath.   Cardiovascular:  Negative for chest pain.  Gastrointestinal:  Positive for abdominal pain. Negative for blood in stool, nausea and vomiting.  Genitourinary:  Negative for dysuria and hematuria.  Skin:   Negative for rash.  All other systems reviewed and are negative.  Physical Exam Updated Vital Signs BP (!) 147/93    Pulse 81    Temp 97.9 F (36.6 C) (Oral)    Resp 20    LMP  (LMP Unknown)    SpO2 98%  Physical Exam Vitals and nursing note reviewed.  Constitutional:      General: She is not in acute distress.    Appearance: She is not diaphoretic.  HENT:     Head: Normocephalic and atraumatic.     Mouth/Throat:     Pharynx: No oropharyngeal exudate.  Eyes:     General: No scleral icterus.    Conjunctiva/sclera: Conjunctivae normal.  Cardiovascular:     Rate and Rhythm: Normal rate and regular rhythm.     Pulses: Normal pulses.     Heart sounds: Normal heart sounds.  Pulmonary:     Effort: Pulmonary effort is normal. No respiratory distress.     Breath sounds: Normal breath sounds. No wheezing.  Abdominal:     General: Bowel sounds are normal.     Palpations: Abdomen is soft. There is no mass.     Tenderness: There is no abdominal tenderness. There is no guarding or rebound.     Comments: No abdominal tenderness to palpation.  Musculoskeletal:        General: Normal range of motion.     Cervical back: Normal range of motion and neck supple.  Skin:  General: Skin is warm and dry.  Neurological:     Mental Status: She is alert.  Psychiatric:        Behavior: Behavior normal.    ED Results / Procedures / Treatments   Labs (all labs ordered are listed, but only abnormal results are displayed) Labs Reviewed  COMPREHENSIVE METABOLIC PANEL - Abnormal; Notable for the following components:      Result Value   Albumin 3.4 (*)    All other components within normal limits  URINALYSIS, COMPLETE (UACMP) WITH MICROSCOPIC - Abnormal; Notable for the following components:   APPearance HAZY (*)    Leukocytes,Ua MODERATE (*)    Bacteria, UA RARE (*)    All other components within normal limits  CBC  LIPASE, BLOOD    EKG None  Radiology US Abdomen Limited RUQ  (LIVER/GB)  Result Date: 09/11/2021 CLINICAL DATA:  Right upper quadrant pain for 4 days EXAM: ULTRASOUND ABDOMEN LIMITED RIGHT UPPER QUADRANT COMPARISON:  None. FINDINGS: Gallbladder: No gallstones or wall thickening visualized. No sonographic Murphy sign noted by sonographer. Common bile duct: Diameter: 4 mm Liver: No focal lesion identified. Within normal limits in parenchymal echogenicity. Portal vein is patent on color Doppler imaging with normal direction of blood flow towards the liver. IMPRESSION: Negative right upper quadrant ultrasound. Electronically Signed   By: Jorje Guild M.D.   On: 09/11/2021 07:46    Procedures Procedures    Medications Ordered in ED Medications - No data to display  ED Course/ Medical Decision Making/ A&P Clinical Course as of 09/11/21 1714  Thu Sep 11, 2021  1710 Discussion with patient at bedside regarding discharge treatment plan.  Patient agreeable at this time.  Patient basic for discharge. [SB]    Clinical Course User Index [SB] Saed Hudlow A, PA-C                           Medical Decision Making Risk Prescription drug management.   Patient presents to the emergency department with RUQ abdominal pain x 4 days. Denies history of GERD. Denies excessive NSAID use, ETOH use, or bleeding. Vital signs stable, pt afebrile, not tachycardic, or hypoxic. On exam patient with no abdominal TTP. Remainder of exam without acute findings.  Pt afebrile. Differential diagnosis includes pancreatitis, UTI, GERD, cholecystitis.  Labs:  I ordered, and personally interpreted labs.  The pertinent results include:  Urinalysis with moderate leukocytes, otherwise no evidence of infection.  CBC unremarkable without leukocytosis.  Lipase unremarkable at 37.  CMP unremarkable with slightly decreased albumin at 3.4. Urine culture sent with results pending at time of discharge.  Imaging: I ordered imaging studies including RUQ [place] I independently visualized and  interpreted imaging which showed: Negative right upper quadrant ultrasound. I agree with the radiologist interpretation  Disposition: Pt presentation suspicious for right upper quadrant abdominal pain without indication for infection as cause. Doubt pancreatitis, acute cystitis, cholecystitis, or GERD at this time. After consideration of the diagnostic results and the patients response to treatment, I feel that the patent would benefit from discharge home with Prilosec.  Will provide information for on-call gastroenterologist.  Instructed patient to call and set up a follow-up appoint regarding today's ED visit with a gastroenterologist.  If urine culture indicative growth, will treat with antibiotics.  Supportive care measures and strict return precautions discussed with patient at bedside. Pt acknowledges and verbalizes understanding. Pt appears safe for discharge. Follow up as indicated in discharge paperwork.  This chart was dictated using voice recognition software, Dragon. Despite the best efforts of this provider to proofread and correct errors, errors may still occur which can change documentation meaning.  Final Clinical Impression(s) / ED Diagnoses Final diagnoses:  Right upper quadrant abdominal pain    Rx / DC Orders ED Discharge Orders          Ordered    omeprazole (PRILOSEC) 20 MG capsule  Daily        09/11/21 1701    ibuprofen (ADVIL) 600 MG tablet  Every 6 hours PRN        09/11/21 1701              Copeland Lapier A, PA-C 09/11/21 1730    Truddie Hidden, MD 09/11/21 2252

## 2021-09-11 NOTE — Discharge Instructions (Addendum)
It was a pleasure taking care of you!  Your work-up was negative in the ED.  You may call your primary care provider to set up a follow-up plan regarding today's ED visit.  Attached is information for the on-call gastroenterologist, call and set up a follow-up appointment regarding today's ED visit. Do not take the prescription ibuprofen for more than 7 days.  We will be sent a prescription for Prilosec, take as prescribed.  Return to the ED if you experience increasing/worsening abdominal pain, nausea, vomiting, worsening symptoms.

## 2021-09-11 NOTE — ED Triage Notes (Incomplete)
Please see downtime charting for patient until 0630.

## 2021-09-12 LAB — I-STAT BETA HCG BLOOD, ED (NOT ORDERABLE): I-stat hCG, quantitative: <5 m[IU]/mL (ref <5)

## 2023-12-25 IMAGING — US US ABDOMEN LIMITED
1 series · 14 of 25 positions shown · non-contrast
Comparison: None.

CLINICAL DATA: Right upper quadrant pain for 4 days

EXAM:
ULTRASOUND ABDOMEN LIMITED RIGHT UPPER QUADRANT

[Series 1: us abdomen limited · 14 of 66 slices shown]
[im 1/66]
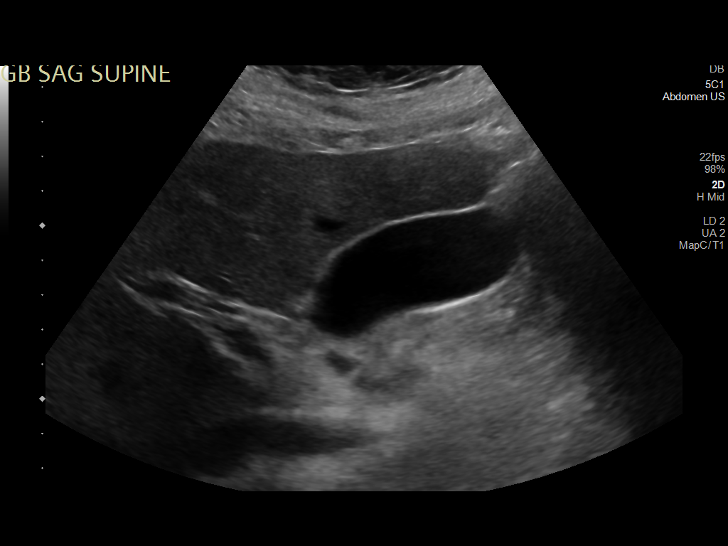
[im 6/66]
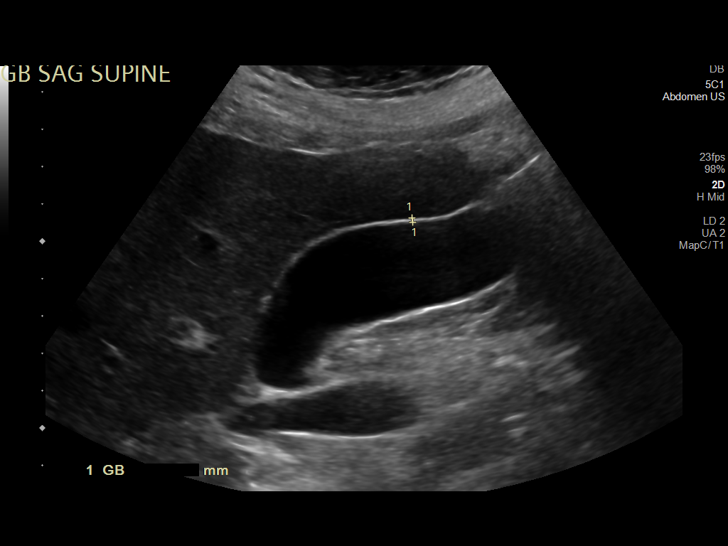
[im 11/66]
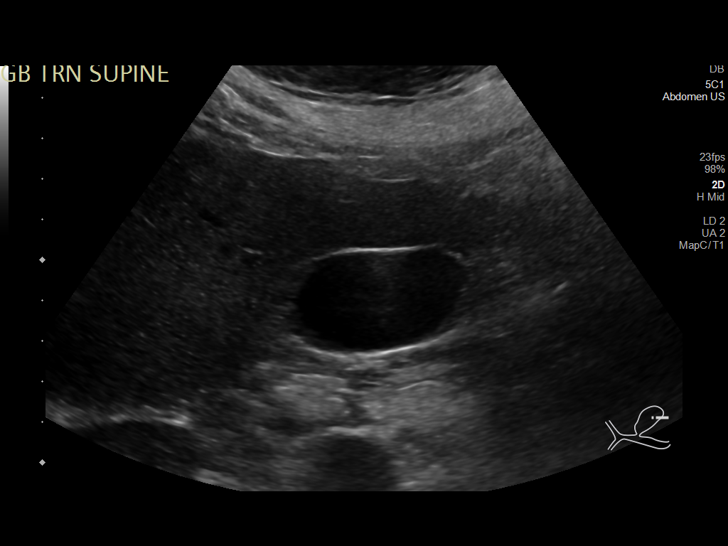
[im 17/66]
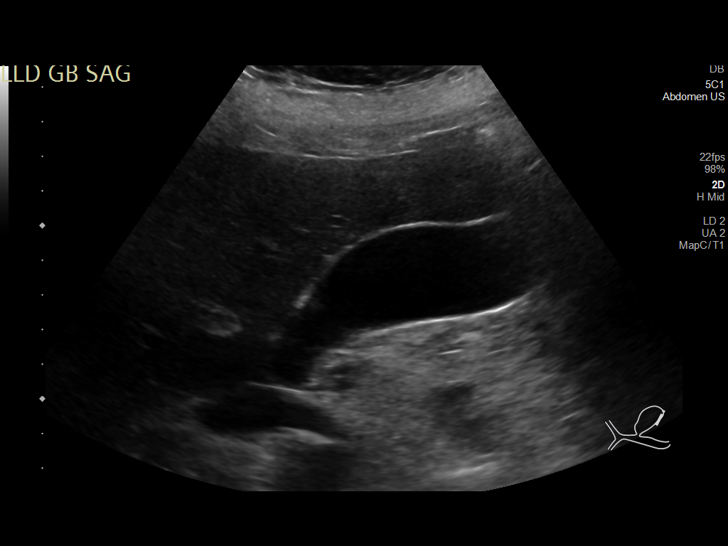
[im 22/66]
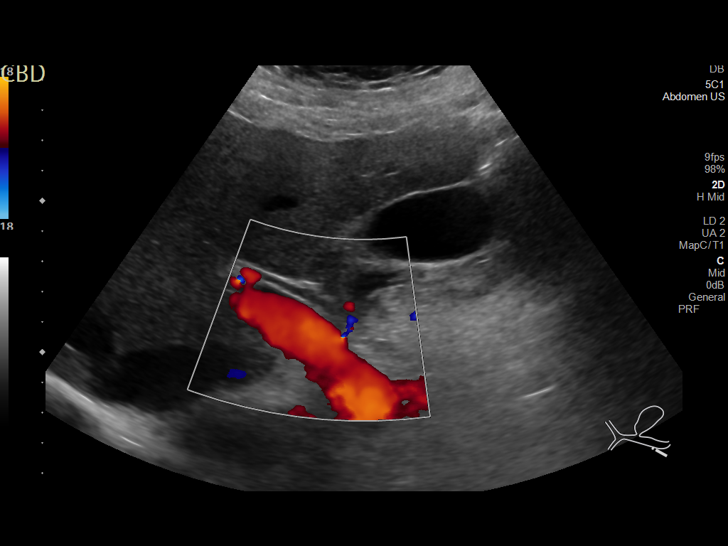
[im 25/66]
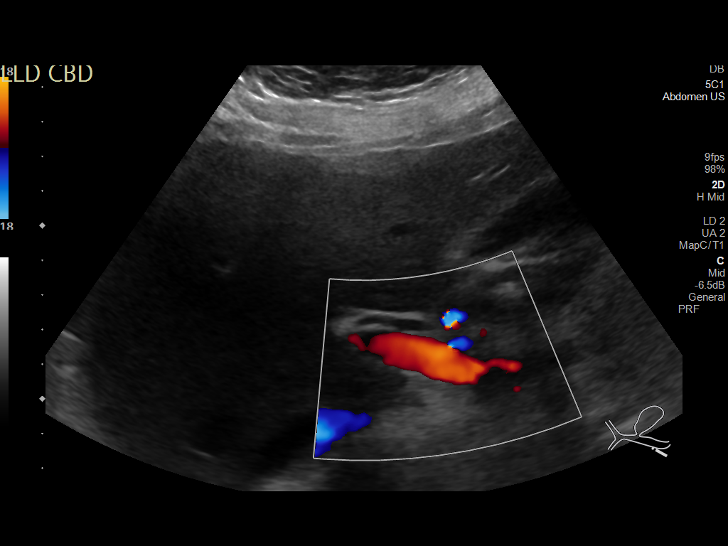
[im 30/66]
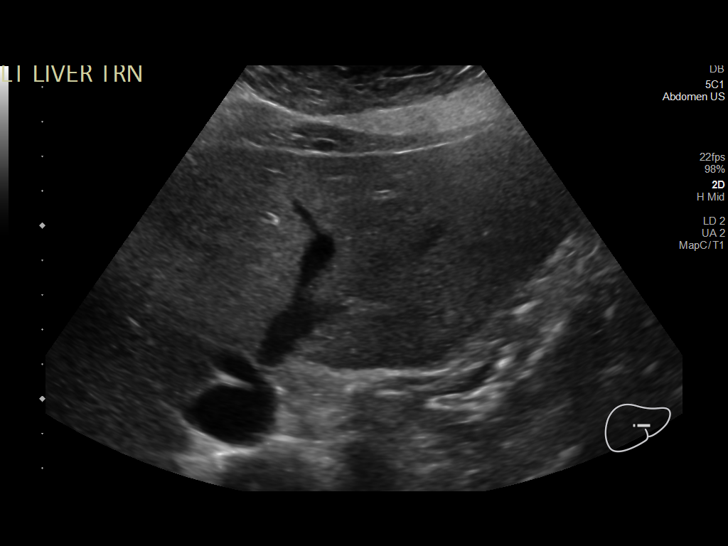
[im 36/66]
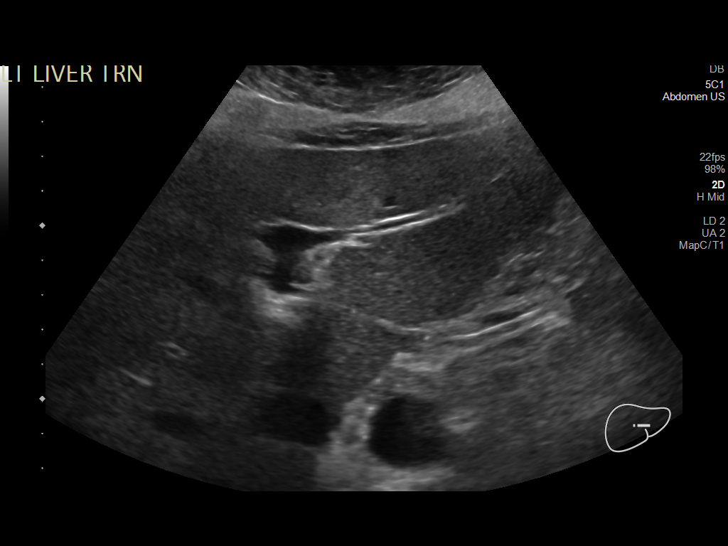
[im 41/66]
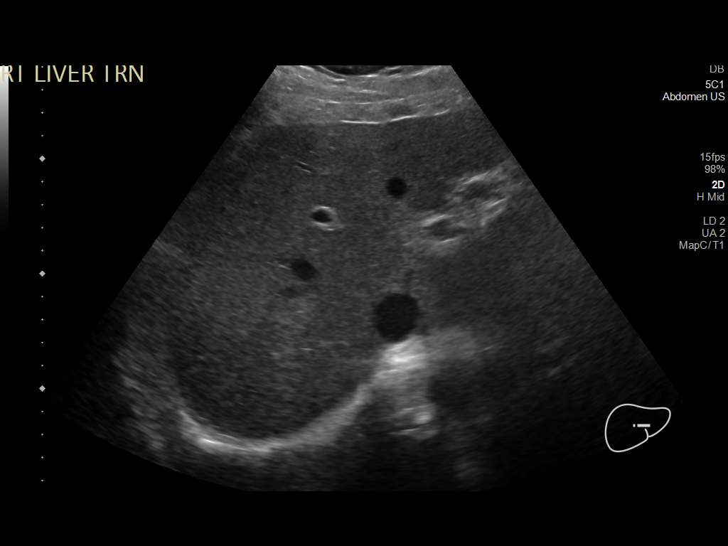
[im 44/66]
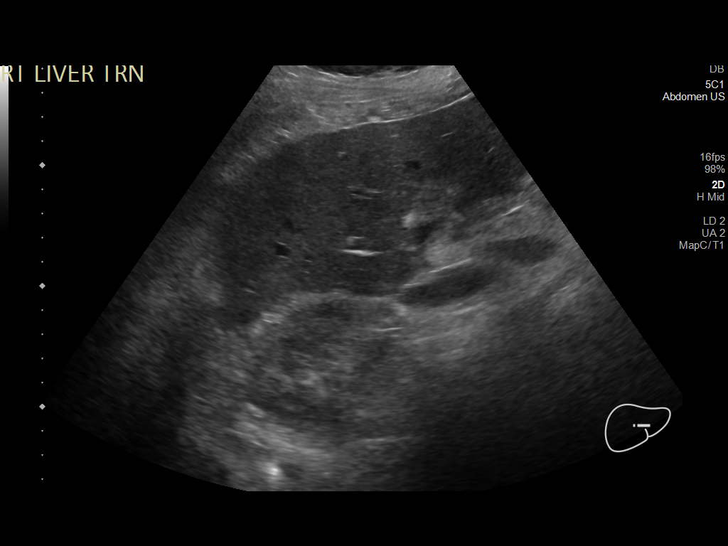
[im 49/66]
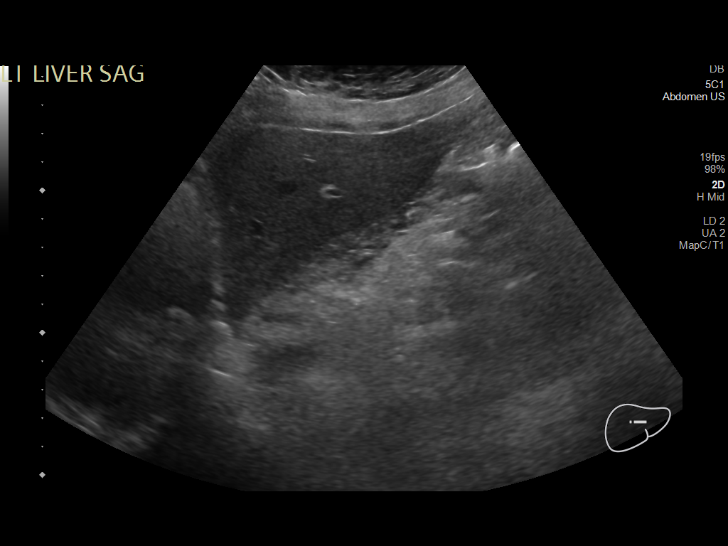
[im 55/66]
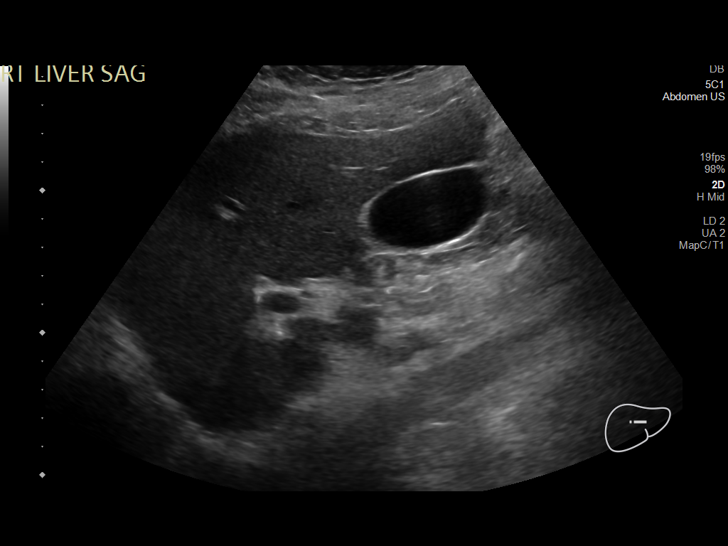
[im 60/66]
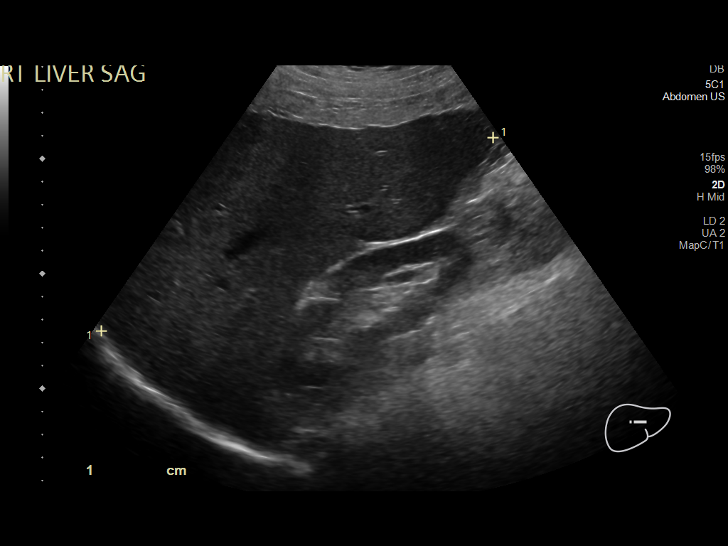
[im 66/66]
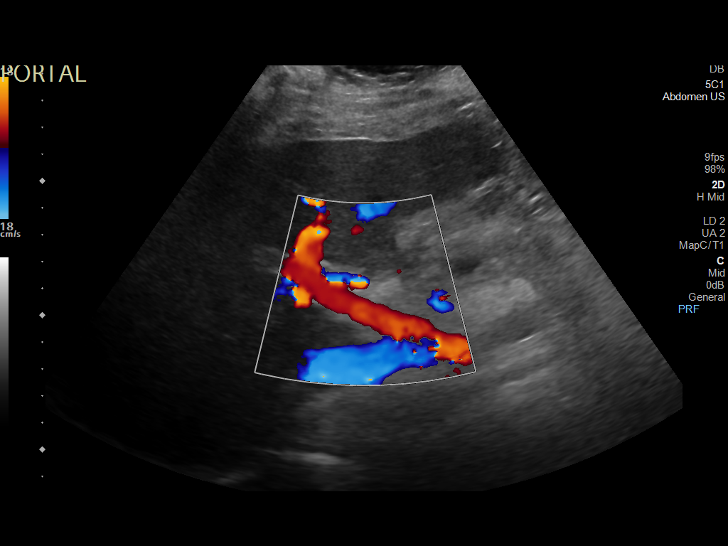

[14 of 25 positions shown; findings below may reference images not displayed]

FINDINGS: Gallbladder:

No gallstones or wall thickening visualized. No sonographic Murphy
sign noted by sonographer.

Common bile duct:

Diameter: 4 mm

Liver:

No focal lesion identified. Within normal limits in parenchymal
echogenicity. Portal vein is patent on color Doppler imaging with
normal direction of blood flow towards the liver.
IMPRESSION: Negative right upper quadrant ultrasound.
